# Patient Record
Sex: Male | Born: 1995
Health system: Southern US, Community
[De-identification: ages and names within clinical notes are randomized; demographics above are authoritative.]

## PROBLEM LIST (undated history)

## (undated) ENCOUNTER — Emergency Department (HOSPITAL_COMMUNITY): Payer: BC Managed Care – PPO

## (undated) DIAGNOSIS — M25661 Stiffness of right knee, not elsewhere classified: Secondary | ICD-10-CM

## (undated) HISTORY — PX: ORIF FEMUR FRACTURE: SHX2119

## (undated) HISTORY — PX: SHOULDER SURGERY: SHX246

## (undated) HISTORY — PX: HIP SURGERY: SHX245

## (undated) HISTORY — PX: FEMUR FRACTURE SURGERY: SHX633

## (undated) HISTORY — PX: OTHER SURGICAL HISTORY: SHX169

## (undated) HISTORY — PX: KNEE SURGERY: SHX244

## (undated) HISTORY — PX: WRIST SURGERY: SHX841

---
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
## 2013-03-19 ENCOUNTER — Ambulatory Visit (INDEPENDENT_AMBULATORY_CARE_PROVIDER_SITE_OTHER): Payer: 59 | Admitting: Physician Assistant

## 2013-03-19 VITALS — BP 100/60 | HR 74 | Temp 97.9°F | Resp 16 | Ht 71.5 in | Wt 122.0 lb

## 2013-03-19 DIAGNOSIS — S81009A Unspecified open wound, unspecified knee, initial encounter: Secondary | ICD-10-CM

## 2013-03-19 DIAGNOSIS — W540XXA Bitten by dog, initial encounter: Secondary | ICD-10-CM

## 2013-03-19 DIAGNOSIS — S81801A Unspecified open wound, right lower leg, initial encounter: Secondary | ICD-10-CM

## 2013-03-19 DIAGNOSIS — M79609 Pain in unspecified limb: Secondary | ICD-10-CM

## 2013-03-19 DIAGNOSIS — T148XXA Other injury of unspecified body region, initial encounter: Secondary | ICD-10-CM

## 2013-03-19 MED ORDER — AMOXICILLIN-POT CLAVULANATE 875-125 MG PO TABS: 1.0000 | ORAL_TABLET | Freq: Two times a day (BID) | ORAL | Status: DC

## 2013-03-19 NOTE — Progress Notes (Signed)
  Subjective:    Patient ID: Gerald Munoz, male    DOB: 10-22-1995, 17 y.o.   MRN: [id number]  HPI 17 year old male presents with dog bite to right calf. Injury occurred today around 11:00 a.m. States he was walking his dog this morning and when he tried to put him back in his cage the dog bit him.  It is an outside dog that stays in his cage except for walks.  Does have a history of being aggressive but has never bitten anybody.  Mother is here with the patient and reports that the dog is not up to date on rabies vaccines. Reports normal activity in the dog and it is eating and drinking normally. Patient reports he did wash wound with soap and water.   Patient is otherwise healthy with no other concerns today.     Review of Systems  Constitutional: Negative for fever and chills.  Gastrointestinal: Negative for nausea and vomiting.  Skin: Positive for wound.  Neurological: Negative for headaches.       Objective:   Physical Exam  Constitutional: He is oriented to person, place, and time. He appears well-developed and well-nourished.  HENT:  Head: Normocephalic and atraumatic.  Right Ear: External ear normal.  Left Ear: External ear normal.  Eyes: Conjunctivae are normal.  Neck: Normal range of motion.  Cardiovascular: Normal rate.   Pulmonary/Chest: Effort normal.  Neurological: He is alert and oriented to person, place, and time.  Skin:     Psychiatric: He has a normal mood and affect. His behavior is normal. Judgment and thought content normal.          Assessment & Plan:  Wound, open, leg, right, initial encounter - Plan: amoxicillin-clavulanate (AUGMENTIN) 875-125 MG per tablet  Dog bite, initial encounter  Superficial wound. Local wound care discussed with patient.  Keep covered until it has healed Report faxed to animal control. Advise vaccination of all animals.  Will go ahead and start augmentin 875 mg bid  Follow up if symptoms worsen or fail to improve.

## 2014-03-06 ENCOUNTER — Encounter (HOSPITAL_COMMUNITY): Payer: Self-pay | Admitting: Emergency Medicine

## 2014-03-06 ENCOUNTER — Emergency Department (HOSPITAL_COMMUNITY): Payer: 59

## 2014-03-06 ENCOUNTER — Emergency Department (HOSPITAL_COMMUNITY)
Admission: EM | Admit: 2014-03-06 | Discharge: 2014-03-07 | Disposition: A | Discharge status: Home / Self Care | Payer: 59 | Attending: Emergency Medicine | Admitting: Emergency Medicine

## 2014-03-06 DIAGNOSIS — M25562 Pain in left knee: Secondary | ICD-10-CM

## 2014-03-06 DIAGNOSIS — Y929 Unspecified place or not applicable: Secondary | ICD-10-CM | POA: Insufficient documentation

## 2014-03-06 DIAGNOSIS — Z8781 Personal history of (healed) traumatic fracture: Secondary | ICD-10-CM | POA: Insufficient documentation

## 2014-03-06 DIAGNOSIS — IMO0002 Reserved for concepts with insufficient information to code with codable children: Secondary | ICD-10-CM | POA: Insufficient documentation

## 2014-03-06 DIAGNOSIS — S83006A Unspecified dislocation of unspecified patella, initial encounter: Secondary | ICD-10-CM | POA: Insufficient documentation

## 2014-03-06 DIAGNOSIS — Y939 Activity, unspecified: Secondary | ICD-10-CM | POA: Insufficient documentation

## 2014-03-06 DIAGNOSIS — S83005A Unspecified dislocation of left patella, initial encounter: Secondary | ICD-10-CM

## 2014-03-06 DIAGNOSIS — F17200 Nicotine dependence, unspecified, uncomplicated: Secondary | ICD-10-CM | POA: Insufficient documentation

## 2014-03-06 DIAGNOSIS — Z792 Long term (current) use of antibiotics: Secondary | ICD-10-CM | POA: Insufficient documentation

## 2014-03-06 MED ORDER — PROPOFOL 10 MG/ML IV BOLUS
INTRAVENOUS | Status: AC
  Administered 2014-03-07: 50 mg
  Filled 2014-03-06: qty 1

## 2014-03-06 MED ORDER — FENTANYL CITRATE 0.05 MG/ML IJ SOLN
100.0000 ug | Freq: Once | INTRAMUSCULAR | Status: AC
  Administered 2014-03-06: 100 ug via INTRAVENOUS

## 2014-03-06 MED ORDER — FENTANYL CITRATE 0.05 MG/ML IJ SOLN
INTRAMUSCULAR | Status: AC
  Administered 2014-03-06: 100 ug via INTRAVENOUS
  Filled 2014-03-06: qty 2

## 2014-03-06 NOTE — ED Notes (Signed)
Bed: HL45 Expected date:  Expected time:  Means of arrival:  Comments: EMS 17yo knee injury

## 2014-03-06 NOTE — ED Notes (Signed)
Pt here via ptar, with c/o left knee pain, Left patella looks displaced on arrival. Pt in splint to left leg on arrival. Rates 10/10 pain. No open site assessment.
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
## 2014-03-07 NOTE — Progress Notes (Signed)
150mg  of propofol wasted. Witnessed by Oneita Jolly, RN

## 2014-03-07 NOTE — Discharge Instructions (Signed)
Ace bandage for comfort and support. Followup with orthopedics. Take Tylenol and Motrin for pain.

## 2015-03-15 ENCOUNTER — Encounter (HOSPITAL_COMMUNITY): Payer: Self-pay | Admitting: Emergency Medicine

## 2015-03-15 ENCOUNTER — Emergency Department (HOSPITAL_COMMUNITY)
Admission: EM | Admit: 2015-03-15 | Discharge: 2015-03-15 | Disposition: A | Discharge status: Home / Self Care | Payer: 59 | Source: Home / Self Care | Attending: Family Medicine | Admitting: Family Medicine

## 2015-03-15 DIAGNOSIS — H7291 Unspecified perforation of tympanic membrane, right ear: Secondary | ICD-10-CM

## 2015-03-15 DIAGNOSIS — H9201 Otalgia, right ear: Secondary | ICD-10-CM

## 2015-03-15 MED ORDER — OFLOXACIN 0.3 % OT SOLN
5.0000 [drp] | Freq: Two times a day (BID) | OTIC | Status: AC
Start: 2015-03-15 — End: 2015-03-19
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
## 2015-03-15 NOTE — Discharge Instructions (Signed)
Eardrum Perforation The eardrum is a thin, round tissue inside the ear. It allows you to hear. The eardrum can get torn (perforated). Eardrums often heal on their own. There is often little or no long-term hearing loss. HOME CARE   Keep your ear dry while it heals. Do not swim, dive, or take showers until your doctor says it is okay.  Before you take a bath, put petroleum jelly all over a cotton ball. Put the cotton ball in your ear. This will keep water out.  Only take medicines as told by your doctor.  Blow your nose gently.  Continue normal activities after your eardrum heals. Your doctor will tell you when your eardrum has healed.  Talk to your doctor before flying on an airplane.  Keep all doctor visits as told. This is important. GET HELP RIGHT AWAY IF:   You have blood or yellowish-white fluid (pus) coming from your ear.  You feel off balance.  You feel dizzy, sick to your stomach (nauseous), or you throw up (vomit).  You have more pain.  You have a fever. MAKE SURE YOU:   Understand these instructions.  Will watch your condition.  Will get help right away if you are not doing well or get worse. Document Released: 03/20/2010 Document Revised: 12/23/2011 Document Reviewed: 03/20/2010 [facility] Patient Information 2015 [employer] [place]. This information is not intended to replace advice given to you by your health care provider. Make sure you discuss any questions you have with your health care provider.    Tiny hole to right ear. This should heal on its own very nicely. The hearing should improve in weeks. If you worsen please f[facility]. Complete antibiotic drops for 5 days.

## 2015-03-16 ENCOUNTER — Ambulatory Visit (INDEPENDENT_AMBULATORY_CARE_PROVIDER_SITE_OTHER): Payer: 59 | Admitting: Physician Assistant

## 2015-03-16 VITALS — BP 100/68 | HR 72 | Temp 98.7°F | Resp 16 | Ht 70.0 in | Wt 123.0 lb

## 2015-03-16 DIAGNOSIS — H7291 Unspecified perforation of tympanic membrane, right ear: Secondary | ICD-10-CM | POA: Diagnosis not present

## 2015-03-16 MED ORDER — CIPROFLOXACIN-HYDROCORTISONE 0.2-1 % OT SUSP: 3.0000 [drp] | Freq: Two times a day (BID) | OTIC | Status: DC

## 2015-03-16 NOTE — Patient Instructions (Signed)
You have a ruptured right ear drum. Please place the antibiotic drops in the right ear three times per day for 7 days. Please come back to see us when you're finished with the course if your hearing seems off.
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
## 2015-03-17 ENCOUNTER — Telehealth: Payer: Self-pay | Admitting: Physician Assistant
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
## 2015-03-18 NOTE — Telephone Encounter (Signed)
Thank you for taking care of this Gerald Munoz

## 2016-02-06 ENCOUNTER — Encounter (HOSPITAL_COMMUNITY): Payer: Self-pay | Admitting: Emergency Medicine

## 2016-02-06 ENCOUNTER — Ambulatory Visit (HOSPITAL_COMMUNITY)
Admission: EM | Admit: 2016-02-06 | Discharge: 2016-02-06 | Disposition: A | Discharge status: Home / Self Care | Payer: 59 | Attending: Family Medicine | Admitting: Family Medicine

## 2016-02-06 DIAGNOSIS — M545 Low back pain, unspecified: Secondary | ICD-10-CM

## 2016-02-06 MED ORDER — NAPROXEN SODIUM 550 MG PO TABS: 550.0000 mg | ORAL_TABLET | Freq: Two times a day (BID) | ORAL | Status: DC

## 2016-02-06 NOTE — Discharge Instructions (Signed)

## 2016-02-06 NOTE — ED Notes (Signed)
[facility] a [facility] pain that started 3 days ago when he was playing with his dog and heard something "pop".

## 2016-02-06 NOTE — ED Provider Notes (Signed)
CSN: [id number]     Arrival date & time 02/06/16  1604 History   First MD Initiated Contact with Patient 02/06/16 1656     Chief Complaint  Patient presents with  . Back Pain   (Consider location/radiation/quality/duration/timing/severity/associated sxs/prior Treatment) HPI History obtained from patient: Location: left low back   Context/Duration: 2 days ago, injury while playing with dog.   Severity:3   Quality:ache Timing:      constant      Home Treatment: none Associated symptoms:  Hurts when sitting for a while.   History reviewed. No pertinent past medical history. Past Surgical History  Procedure Laterality Date  . Femur fracture surgery     History reviewed. No pertinent family history. Social History  Substance Use Topics  . Smoking status: Current Some Day Smoker    Types: Cigarettes  . Smokeless tobacco: Never Used  . Alcohol Use: 0.0 oz/week    0 Standard drinks or equivalent per week     Comment: socially    Review of Systems ROS +'ve lumbar back pain  Denies: HEADACHE, NAUSEA, ABDOMINAL PAIN, CHEST PAIN, CONGESTION, DYSURIA, SHORTNESS OF BREATH  Allergies  Review of patient's allergies indicates no known allergies.  Home Medications   Prior to Admission medications   Medication Sig Start Date End Date Taking? Authorizing Provider  ciprofloxacin-hydrocortisone (CIPRO HC) otic suspension Place 3 drops into the right ear 2 (two) times daily. 03/16/15   Raelyn Ensign, PA  ibuprofen (ADVIL,MOTRIN) 200 MG tablet Take 800 mg by mouth every 6 (six) hours as needed (pain).    Historical Provider, MD  naproxen sodium (ANAPROX DS) 550 MG tablet Take 1 tablet (550 mg total) by mouth 2 (two) times daily with a meal. 02/06/16   Tharon Aquas, PA   Meds Ordered and Administered this Visit  Medications - No data to display  BP 100/56 mmHg  Pulse 66  Temp(Src) 98.6 F (37 C) (Oral)  Resp 14  SpO2 100% No data found.   Physical Exam NURSES NOTES AND VITAL  SIGNS REVIEWED. CONSTITUTIONAL: Well developed, well nourished, no acute distress HEENT: normocephalic, atraumatic EYES: Conjunctiva normal NECK:normal ROM, supple, no adenopathy PULMONARY:No respiratory distress, normal effort MUSCULOSKELETAL: Normal ROM of all extremities, Left lumbar spine, no palpable tenderness or spasm Back has full ROM SKIN: warm and dry without rash PSYCHIATRIC: Mood and affect, behavior are normal  ED Course  Procedures (including critical care time)  Labs Review Labs Reviewed - No data to display  Imaging Review No results found.   Visual Acuity Review  Right Eye Distance:   Left Eye Distance:   Bilateral Distance:    Right Eye Near:   Left Eye Near:    Bilateral Near:      PRESCRIPTION: anaprox  SUGGEST CONTINUED SYMPTOMATIC TREATMENT AT HOME.    MDM   1. Left-sided low back pain without sciatica     Patient is reassured that there are no issues that require transfer to higher level of care at this time or additional tests. Patient is advised to continue home symptomatic treatment. Patient is advised that if there are new or worsening symptoms to attend the emergency department, contact primary care provider, or return to UC. Instructions of care provided discharged home in stable condition.    THIS NOTE WAS GENERATED USING A VOICE RECOGNITION SOFTWARE PROGRAM. ALL REASONABLE EFFORTS  WERE MADE TO PROOFREAD THIS DOCUMENT FOR ACCURACY.  I have verbally reviewed the discharge instructions with the patient. A printed AVS  was given to the patient.  All questions were answered prior to discharge.      Tharon AquasFrank C Jamarious Febo, PA 02/06/16 [phone]

## 2016-10-09 ENCOUNTER — Ambulatory Visit (INDEPENDENT_AMBULATORY_CARE_PROVIDER_SITE_OTHER): Payer: 59 | Admitting: Physician Assistant

## 2016-10-09 VITALS — BP 116/54 | HR 86 | Temp 98.5°F | Ht 70.0 in | Wt 131.0 lb

## 2016-10-09 DIAGNOSIS — R1084 Generalized abdominal pain: Secondary | ICD-10-CM

## 2016-10-09 DIAGNOSIS — E860 Dehydration: Secondary | ICD-10-CM | POA: Diagnosis not present

## 2016-10-09 DIAGNOSIS — R110 Nausea: Secondary | ICD-10-CM

## 2016-10-09 LAB — POCT URINALYSIS DIP (MANUAL ENTRY)
BILIRUBIN UA: NEGATIVE
Blood, UA: NEGATIVE
Glucose, UA: NEGATIVE
Leukocytes, UA: NEGATIVE
NITRITE UA: NEGATIVE
PH UA: 5.5
Spec Grav, UA: >=1.03
Urobilinogen, UA: 0.2

## 2016-10-09 LAB — POCT CBC
Granulocyte percent: 88.7 %G — AB (ref 37–80)
HCT, POC: 45.2 % (ref 43.5–53.7)
HEMOGLOBIN: 15.8 g/dL (ref 14.1–18.1)
LYMPH, POC: 0.8 (ref 0.6–3.4)
MCH: 30.9 pg (ref 27–31.2)
MCHC: 35 g/dL (ref 31.8–35.4)
MCV: 88.2 fL (ref 80–97)
MID (cbc): 0.9 (ref 0–0.9)
MPV: 7.2 fL (ref 0–99.8)
POC GRANULOCYTE: 13.5 — AB (ref 2–6.9)
POC LYMPH PERCENT: 5.1 %L — AB (ref 10–50)
POC MID %: 6.2 %M (ref 0–12)
Platelet Count, POC: 248 10*3/uL (ref 142–424)
RBC: 5.13 M/uL (ref 4.69–6.13)
RDW, POC: 12.4 %
WBC: 15.2 10*3/uL — AB (ref 4.6–10.2)

## 2016-10-09 MED ORDER — TRAMADOL HCL 50 MG PO TABS: 25.0000 mg | ORAL_TABLET | Freq: Three times a day (TID) | ORAL | 0 refills | Status: DC | PRN

## 2016-10-09 MED ORDER — ONDANSETRON HCL 4 MG PO TABS: 4.0000 mg | ORAL_TABLET | Freq: Three times a day (TID) | ORAL | 0 refills | Status: DC | PRN

## 2016-10-09 MED ORDER — ONDANSETRON 4 MG PO TBDP
8.0000 mg | ORAL_TABLET | Freq: Once | ORAL | Status: AC
  Administered 2016-10-09: 8 mg via ORAL

## 2016-10-09 NOTE — Patient Instructions (Addendum)
If you begin to have fever then go to the ED.     IF you received an x-ray today, you will receive an invoice from [facility]. Please contact [facility] at [phone] with questions or concerns regarding your invoice.   IF you received labwork today, you will receive an invoice from [facility]. Please contact [facility] at [phone] with questions or concerns regarding your invoice.   Our billing staff will not be able to assist you with questions regarding bills from these companies.  You will be contacted with the lab results as soon as they are available. The fastest way to get your results is to activate your My Chart account. Instructions are located on the last page of this paperwork. If you have not heard from us regarding the results in 2 weeks, please contact this office.

## 2016-10-09 NOTE — Progress Notes (Signed)
10/09/2016 3:19 PM   DOB: December 07, 1995 / MRN: [id number]  SUBJECTIVE:  Gerald Munoz is a 20 y.o. male presenting for nausea and stomach pain that started roughly 15 hours ago.  No emesis. Reports he has had about 2 episodes of non bloody watery stool since.  He denies fever. Reports he drank 1/2 gallon of chocolate milk 30 minutes before his symptoms started. He has tried [employer] bismal and this did not help.   He has No Known Allergies.   He  has no past medical history on file.    He  reports that he has been smoking Cigarettes.  He has never used smokeless tobacco. He reports that he drinks alcohol. He reports that he does not use drugs. He  has no sexual activity history on file. The patient  has a past surgical history that includes Femur fracture surgery.  His family history is not on file.  Review of Systems  Constitutional: Negative for fever.  Respiratory: Negative for cough.   Cardiovascular: Negative for chest pain.  Gastrointestinal: Negative for nausea.  Skin: Negative for itching and rash.  Neurological: Negative for dizziness.    The problem list and medications were reviewed and updated by myself where necessary and exist elsewhere in the encounter.   OBJECTIVE:  BP (!) 116/54 (BP Location: Right Arm, Patient Position: Sitting, Cuff Size: Small)   Pulse 86   Temp 98.5 F (36.9 C) (Oral)   Ht 5\' 10"  (1.778 m)   Wt 131 lb (59.4 kg)   SpO2 100%   BMI 18.80 kg/m   BP Readings from Last 3 Encounters:  10/09/16 (!) 116/54  02/06/16 100/56  03/16/15 100/68     Physical Exam  Constitutional: He is oriented to person, place, and time. He appears well-developed and well-nourished.  Cardiovascular: Normal rate and regular rhythm.   Pulmonary/Chest: Effort normal and breath sounds normal.  Abdominal: Soft. Bowel sounds are normal. He exhibits no distension and no mass. There is no tenderness. There is no rebound and no guarding.  Musculoskeletal: Normal  range of motion.  Neurological: He is alert and oriented to person, place, and time.  Skin: Skin is warm and dry.  Psychiatric: He has a normal mood and affect.    Results for orders placed or performed in visit on 10/09/16 (from the past 72 hour(s))  POCT urinalysis dipstick     Status: Abnormal   Collection Time: 10/09/16  3:01 PM  Result Value Ref Range   Color, UA yellow yellow   Clarity, UA clear clear   Glucose, UA negative negative   Bilirubin, UA negative negative   Ketones, POC UA small (15) (A) negative   Spec Grav, UA >=1.030    Blood, UA negative negative   pH, UA 5.5    Protein Ur, POC trace (A) negative   Urobilinogen, UA 0.2    Nitrite, UA Negative Negative   Leukocytes, UA Negative Negative  POCT CBC     Status: Abnormal   Collection Time: 10/09/16  3:02 PM  Result Value Ref Range   WBC 15.2 (A) 4.6 - 10.2 K/uL   Lymph, poc 0.8 0.6 - 3.4   POC LYMPH PERCENT 5.1 (A) 10 - 50 %L   MID (cbc) 0.9 0 - 0.9   POC MID % 6.2 0 - 12 %M   POC Granulocyte 13.5 (A) 2 - 6.9   Granulocyte percent 88.7 (A) 37 - 80 %G   RBC 5.13 4.69 - 6.13 M/uL  Hemoglobin 15.8 14.1 - 18.1 g/dL   HCT, POC 40.945.2 81.143.5 - 53.7 %   MCV 88.2 80 - 97 fL   MCH, POC 30.9 27 - 31.2 pg   MCHC 35.0 31.8 - 35.4 g/dL   RDW, POC 91.412.4 %   Platelet Count, POC 248 142 - 424 K/uL   MPV 7.2 0 - 99.8 fL    No results found.  ASSESSMENT AND PLAN  Gerald Munoz was seen today for abdominal pain.  Diagnoses and all orders for this visit:  Nausea without vomiting -     ondansetron (ZOFRAN-ODT) disintegrating tablet 8 mg; Take 2 tablets (8 mg total) by mouth once. -     POCT CBC -     POCT urinalysis dipstick  Generalized abdominal pain: He has a high white count however he DOES NOT HAVE A FEVER and has not taken medication to reduce fever. I think his white count is reactive, possibly from nausea and dry heaving.  I have advised that if he gets a fever tonight to go directly to the ED as I am not sending  him for a scan based off of his belly exam and the fact that he denies fever and has no fever here. He agrees that is he has a fever then he will go directly to the ED.         -     traMADol (ULTRAM) 50 MG tablet; Take 0.5-1 tablets (25-50 mg total) by mouth every        8 (eight) hours as needed.   Dehydration: Patient opting to drink copious fluids at home.         The patient is advised to call or return to clinic if he does not see an improvement in symptoms, or to seek the care of the closest emergency department if he worsens with the above plan.   Gerald BostonMichael Munoz, MHS, PA-C [facility] 10/09/2016 3:19 PM

## 2017-05-18 ENCOUNTER — Emergency Department (HOSPITAL_COMMUNITY): Payer: 59

## 2017-05-18 ENCOUNTER — Inpatient Hospital Stay (HOSPITAL_COMMUNITY): Payer: 59 | Admitting: Certified Registered Nurse Anesthetist

## 2017-05-18 ENCOUNTER — Encounter (HOSPITAL_COMMUNITY): Admission: EM | Disposition: A | Discharge status: Home / Self Care | Payer: Self-pay | Source: Home / Self Care

## 2017-05-18 ENCOUNTER — Inpatient Hospital Stay (HOSPITAL_COMMUNITY): Payer: 59

## 2017-05-18 ENCOUNTER — Inpatient Hospital Stay (HOSPITAL_COMMUNITY)
Admission: EM | Admit: 2017-05-18 | Discharge: 2017-05-21 | DRG: 956 | Disposition: A | Discharge status: Home / Self Care | Payer: 59 | Attending: General Surgery | Admitting: General Surgery

## 2017-05-18 ENCOUNTER — Encounter (HOSPITAL_COMMUNITY): Payer: Self-pay | Admitting: *Deleted

## 2017-05-18 DIAGNOSIS — S52572A Other intraarticular fracture of lower end of left radius, initial encounter for closed fracture: Secondary | ICD-10-CM | POA: Diagnosis present

## 2017-05-18 DIAGNOSIS — S72322A Displaced transverse fracture of shaft of left femur, initial encounter for closed fracture: Secondary | ICD-10-CM | POA: Diagnosis not present

## 2017-05-18 DIAGNOSIS — S52502A Unspecified fracture of the lower end of left radius, initial encounter for closed fracture: Secondary | ICD-10-CM | POA: Diagnosis not present

## 2017-05-18 DIAGNOSIS — T148XXA Other injury of unspecified body region, initial encounter: Secondary | ICD-10-CM

## 2017-05-18 DIAGNOSIS — S0181XA Laceration without foreign body of other part of head, initial encounter: Secondary | ICD-10-CM | POA: Diagnosis not present

## 2017-05-18 DIAGNOSIS — S0990XA Unspecified injury of head, initial encounter: Secondary | ICD-10-CM | POA: Diagnosis not present

## 2017-05-18 DIAGNOSIS — S199XXA Unspecified injury of neck, initial encounter: Secondary | ICD-10-CM | POA: Diagnosis not present

## 2017-05-18 DIAGNOSIS — F17200 Nicotine dependence, unspecified, uncomplicated: Secondary | ICD-10-CM | POA: Diagnosis present

## 2017-05-18 DIAGNOSIS — S72332A Displaced oblique fracture of shaft of left femur, initial encounter for closed fracture: Secondary | ICD-10-CM | POA: Diagnosis not present

## 2017-05-18 DIAGNOSIS — S52531A Colles' fracture of right radius, initial encounter for closed fracture: Secondary | ICD-10-CM

## 2017-05-18 DIAGNOSIS — S728X2A Other fracture of left femur, initial encounter for closed fracture: Secondary | ICD-10-CM | POA: Diagnosis not present

## 2017-05-18 DIAGNOSIS — S7292XA Unspecified fracture of left femur, initial encounter for closed fracture: Secondary | ICD-10-CM | POA: Diagnosis present

## 2017-05-18 DIAGNOSIS — S72352A Displaced comminuted fracture of shaft of left femur, initial encounter for closed fracture: Principal | ICD-10-CM | POA: Diagnosis present

## 2017-05-18 DIAGNOSIS — T84195A Other mechanical complication of internal fixation device of left femur, initial encounter: Secondary | ICD-10-CM | POA: Diagnosis not present

## 2017-05-18 DIAGNOSIS — S7222XA Displaced subtrochanteric fracture of left femur, initial encounter for closed fracture: Secondary | ICD-10-CM | POA: Diagnosis not present

## 2017-05-18 DIAGNOSIS — S52501B Unspecified fracture of the lower end of right radius, initial encounter for open fracture type I or II: Secondary | ICD-10-CM | POA: Diagnosis present

## 2017-05-18 DIAGNOSIS — S52601A Unspecified fracture of lower end of right ulna, initial encounter for closed fracture: Secondary | ICD-10-CM | POA: Diagnosis not present

## 2017-05-18 DIAGNOSIS — S5291XB Unspecified fracture of right forearm, initial encounter for open fracture type I or II: Secondary | ICD-10-CM | POA: Diagnosis not present

## 2017-05-18 DIAGNOSIS — T79A11A Traumatic compartment syndrome of right upper extremity, initial encounter: Secondary | ICD-10-CM | POA: Diagnosis not present

## 2017-05-18 DIAGNOSIS — S52571B Other intraarticular fracture of lower end of right radius, initial encounter for open fracture type I or II: Secondary | ICD-10-CM | POA: Diagnosis not present

## 2017-05-18 DIAGNOSIS — S060X9A Concussion with loss of consciousness of unspecified duration, initial encounter: Secondary | ICD-10-CM | POA: Diagnosis not present

## 2017-05-18 DIAGNOSIS — M25532 Pain in left wrist: Secondary | ICD-10-CM

## 2017-05-18 DIAGNOSIS — S098XXA Other specified injuries of head, initial encounter: Secondary | ICD-10-CM | POA: Diagnosis not present

## 2017-05-18 DIAGNOSIS — S3991XA Unspecified injury of abdomen, initial encounter: Secondary | ICD-10-CM

## 2017-05-18 DIAGNOSIS — R52 Pain, unspecified: Secondary | ICD-10-CM

## 2017-05-18 DIAGNOSIS — W130XXA Fall from, out of or through balcony, initial encounter: Secondary | ICD-10-CM | POA: Diagnosis present

## 2017-05-18 DIAGNOSIS — D62 Acute posthemorrhagic anemia: Secondary | ICD-10-CM | POA: Diagnosis not present

## 2017-05-18 DIAGNOSIS — Z4789 Encounter for other orthopedic aftercare: Secondary | ICD-10-CM | POA: Diagnosis not present

## 2017-05-18 DIAGNOSIS — S52612A Displaced fracture of left ulna styloid process, initial encounter for closed fracture: Secondary | ICD-10-CM | POA: Diagnosis present

## 2017-05-18 DIAGNOSIS — S52501A Unspecified fracture of the lower end of right radius, initial encounter for closed fracture: Secondary | ICD-10-CM | POA: Diagnosis not present

## 2017-05-18 DIAGNOSIS — S161XXA Strain of muscle, fascia and tendon at neck level, initial encounter: Secondary | ICD-10-CM

## 2017-05-18 DIAGNOSIS — S298XXA Other specified injuries of thorax, initial encounter: Secondary | ICD-10-CM

## 2017-05-18 DIAGNOSIS — M25531 Pain in right wrist: Secondary | ICD-10-CM | POA: Diagnosis not present

## 2017-05-18 DIAGNOSIS — Z419 Encounter for procedure for purposes other than remedying health state, unspecified: Secondary | ICD-10-CM

## 2017-05-18 DIAGNOSIS — S3993XA Unspecified injury of pelvis, initial encounter: Secondary | ICD-10-CM | POA: Diagnosis not present

## 2017-05-18 DIAGNOSIS — S299XXA Unspecified injury of thorax, initial encounter: Secondary | ICD-10-CM | POA: Diagnosis not present

## 2017-05-18 DIAGNOSIS — W010XXA Fall on same level from slipping, tripping and stumbling without subsequent striking against object, initial encounter: Secondary | ICD-10-CM | POA: Diagnosis not present

## 2017-05-18 HISTORY — PX: HARDWARE REMOVAL: SHX979

## 2017-05-18 HISTORY — PX: INTRAMEDULLARY (IM) NAIL INTERTROCHANTERIC: SHX5875

## 2017-05-18 HISTORY — PX: ORIF WRIST FRACTURE: SHX2133

## 2017-05-18 LAB — URINALYSIS, MICROSCOPIC (REFLEX)
Bacteria, UA: NONE SEEN
RBC / HPF: NONE SEEN RBC/hpf (ref 0–5)
SQUAMOUS EPITHELIAL / LPF: NONE SEEN

## 2017-05-18 LAB — PREPARE FRESH FROZEN PLASMA
UNIT DIVISION: 0
UNIT DIVISION: 0

## 2017-05-18 LAB — BPAM RBC
BLOOD PRODUCT EXPIRATION DATE: 201808072359
Blood Product Expiration Date: 201808062359
ISSUE DATE / TIME: 201808050455
ISSUE DATE / TIME: 201808050455
UNIT TYPE AND RH: 9500
UNIT TYPE AND RH: 9500

## 2017-05-18 LAB — COMPREHENSIVE METABOLIC PANEL
ALBUMIN: 4.2 g/dL (ref 3.5–5.0)
ALT: 21 U/L (ref 17–63)
ANION GAP: 9 (ref 5–15)
AST: 40 U/L (ref 15–41)
Alkaline Phosphatase: 42 U/L (ref 38–126)
BILIRUBIN TOTAL: 0.7 mg/dL (ref 0.3–1.2)
BUN: 11 mg/dL (ref 6–20)
CO2: 21 mmol/L — ABNORMAL LOW (ref 22–32)
Calcium: 8.6 mg/dL — ABNORMAL LOW (ref 8.9–10.3)
Chloride: 109 mmol/L (ref 101–111)
Creatinine, Ser: 0.93 mg/dL (ref 0.61–1.24)
GFR calc Af Amer: >60 mL/min (ref >60)
Glucose, Bld: 100 mg/dL — ABNORMAL HIGH (ref 65–99)
POTASSIUM: 3.1 mmol/L — AB (ref 3.5–5.1)
Sodium: 139 mmol/L (ref 135–145)
TOTAL PROTEIN: 6.6 g/dL (ref 6.5–8.1)

## 2017-05-18 LAB — URINALYSIS, ROUTINE W REFLEX MICROSCOPIC
Bilirubin Urine: NEGATIVE
Glucose, UA: NEGATIVE mg/dL
Ketones, ur: NEGATIVE mg/dL
Leukocytes, UA: NEGATIVE
NITRITE: NEGATIVE
Protein, ur: NEGATIVE mg/dL
SPECIFIC GRAVITY, URINE: 1.01 (ref 1.005–1.030)
pH: 7 (ref 5.0–8.0)

## 2017-05-18 LAB — TYPE AND SCREEN
ABO/RH(D): O POS
ANTIBODY SCREEN: NEGATIVE
Unit division: 0
Unit division: 0

## 2017-05-18 LAB — BPAM FFP
Blood Product Expiration Date: 201808082359
Blood Product Expiration Date: 201808082359
ISSUE DATE / TIME: 201808050457
ISSUE DATE / TIME: 201808050457
UNIT TYPE AND RH: 6200
UNIT TYPE AND RH: 6200

## 2017-05-18 LAB — CBC
HEMATOCRIT: 37.3 % — AB (ref 39.0–52.0)
HEMOGLOBIN: 13.1 g/dL (ref 13.0–17.0)
MCH: 30 pg (ref 26.0–34.0)
MCHC: 35.1 g/dL (ref 30.0–36.0)
MCV: 85.6 fL (ref 78.0–100.0)
Platelets: 224 10*3/uL (ref 150–400)
RBC: 4.36 MIL/uL (ref 4.22–5.81)
RDW: 12.1 % (ref 11.5–15.5)
WBC: 8.7 10*3/uL (ref 4.0–10.5)

## 2017-05-18 LAB — PROTIME-INR
INR: 1.01
Prothrombin Time: 13.3 seconds (ref 11.4–15.2)

## 2017-05-18 LAB — I-STAT CHEM 8, ED
BUN: 12 mg/dL (ref 6–20)
CALCIUM ION: 1.11 mmol/L — AB (ref 1.15–1.40)
CREATININE: 0.9 mg/dL (ref 0.61–1.24)
Chloride: 110 mmol/L (ref 101–111)
Glucose, Bld: 94 mg/dL (ref 65–99)
HCT: 40 % (ref 39.0–52.0)
Hemoglobin: 13.6 g/dL (ref 13.0–17.0)
Potassium: 3.3 mmol/L — ABNORMAL LOW (ref 3.5–5.1)
SODIUM: 144 mmol/L (ref 135–145)
TCO2: 23 mmol/L (ref 0–100)

## 2017-05-18 LAB — CDS SEROLOGY

## 2017-05-18 LAB — ETHANOL: Alcohol, Ethyl (B): 78 mg/dL — ABNORMAL HIGH (ref <5)

## 2017-05-18 LAB — ABO/RH: ABO/RH(D): O POS

## 2017-05-18 LAB — I-STAT CG4 LACTIC ACID, ED: LACTIC ACID, VENOUS: 3.72 mmol/L — AB (ref 0.5–1.9)

## 2017-05-18 LAB — BLOOD PRODUCT ORDER (VERBAL) VERIFICATION

## 2017-05-18 SURGERY — OPEN REDUCTION INTERNAL FIXATION (ORIF) WRIST FRACTURE
Anesthesia: General | Site: Leg Upper | Laterality: Right

## 2017-05-18 SURGERY — OPEN REDUCTION INTERNAL FIXATION (ORIF) DISTAL RADIUS FRACTURE
Anesthesia: General | Laterality: Right

## 2017-05-18 MED ORDER — ROCURONIUM BROMIDE 100 MG/10ML IV SOLN
INTRAVENOUS | Status: DC | PRN
  Administered 2017-05-18: 30 mg via INTRAVENOUS
  Administered 2017-05-18: 50 mg via INTRAVENOUS
  Administered 2017-05-18: 30 mg via INTRAVENOUS
  Administered 2017-05-18: 20 mg via INTRAVENOUS
  Administered 2017-05-18: 50 mg via INTRAVENOUS
  Administered 2017-05-18: 40 mg via INTRAVENOUS

## 2017-05-18 MED ORDER — FENTANYL CITRATE (PF) 100 MCG/2ML IJ SOLN
25.0000 ug | Freq: Once | INTRAMUSCULAR | Status: AC
  Administered 2017-05-18: 25 ug via INTRAVENOUS
  Filled 2017-05-18: qty 2

## 2017-05-18 MED ORDER — DEXTROSE 5 % IV SOLN: 1000.0000 mg | Freq: Four times a day (QID) | INTRAVENOUS | Status: DC | PRN

## 2017-05-18 MED ORDER — BISACODYL 5 MG PO TBEC: 5.0000 mg | DELAYED_RELEASE_TABLET | Freq: Every day | ORAL | Status: DC | PRN

## 2017-05-18 MED ORDER — METOCLOPRAMIDE HCL 5 MG PO TABS: 5.0000 mg | ORAL_TABLET | Freq: Three times a day (TID) | ORAL | Status: DC | PRN

## 2017-05-18 MED ORDER — CEFAZOLIN SODIUM-DEXTROSE 1-4 GM/50ML-% IV SOLN
1.0000 g | Freq: Once | INTRAVENOUS | Status: AC
  Administered 2017-05-18: 1 g via INTRAVENOUS
  Filled 2017-05-18: qty 50

## 2017-05-18 MED ORDER — DIPHENHYDRAMINE HCL 12.5 MG/5ML PO ELIX: 12.5000 mg | ORAL_SOLUTION | ORAL | Status: DC | PRN

## 2017-05-18 MED ORDER — ONDANSETRON HCL 4 MG PO TABS: 4.0000 mg | ORAL_TABLET | Freq: Four times a day (QID) | ORAL | Status: DC | PRN

## 2017-05-18 MED ORDER — FENTANYL CITRATE (PF) 250 MCG/5ML IJ SOLN
INTRAMUSCULAR | Status: AC
  Filled 2017-05-18: qty 5

## 2017-05-18 MED ORDER — PROPOFOL 10 MG/ML IV BOLUS
INTRAVENOUS | Status: AC
  Filled 2017-05-18: qty 20

## 2017-05-18 MED ORDER — DEXAMETHASONE SODIUM PHOSPHATE 4 MG/ML IJ SOLN
INTRAMUSCULAR | Status: DC | PRN
  Administered 2017-05-18: 8 mg via INTRAVENOUS

## 2017-05-18 MED ORDER — OXYCODONE-ACETAMINOPHEN 5-325 MG PO TABS
1.0000 | ORAL_TABLET | Freq: Four times a day (QID) | ORAL | Status: DC | PRN
  Administered 2017-05-18 – 2017-05-20 (×3): 2 via ORAL
  Filled 2017-05-18 (×3): qty 2

## 2017-05-18 MED ORDER — FENTANYL CITRATE (PF) 100 MCG/2ML IJ SOLN
50.0000 ug | Freq: Once | INTRAMUSCULAR | Status: AC
  Administered 2017-05-18: 50 ug via INTRAVENOUS
  Filled 2017-05-18: qty 2

## 2017-05-18 MED ORDER — OXYCODONE HCL 5 MG PO TABS
5.0000 mg | ORAL_TABLET | ORAL | Status: DC | PRN
Start: 2017-05-18 — End: 2017-05-19
  Administered 2017-05-18 – 2017-05-19 (×3): 10 mg via ORAL
  Filled 2017-05-18 (×3): qty 2

## 2017-05-18 MED ORDER — HYDROMORPHONE HCL 1 MG/ML IJ SOLN
INTRAMUSCULAR | Status: AC
  Administered 2017-05-18: 0.5 mg via INTRAVENOUS
  Filled 2017-05-18: qty 1

## 2017-05-18 MED ORDER — STERILE WATER FOR IRRIGATION IR SOLN
Status: DC | PRN
  Administered 2017-05-18: 1000 mL

## 2017-05-18 MED ORDER — ONDANSETRON HCL 4 MG/2ML IJ SOLN: 4.0000 mg | Freq: Four times a day (QID) | INTRAMUSCULAR | Status: DC | PRN

## 2017-05-18 MED ORDER — CEFAZOLIN SODIUM-DEXTROSE 1-4 GM/50ML-% IV SOLN
1.0000 g | Freq: Four times a day (QID) | INTRAVENOUS | Status: AC
  Administered 2017-05-18 – 2017-05-19 (×3): 1 g via INTRAVENOUS
  Filled 2017-05-18 (×3): qty 50

## 2017-05-18 MED ORDER — ONDANSETRON HCL 4 MG/2ML IJ SOLN
INTRAMUSCULAR | Status: DC | PRN
  Administered 2017-05-18: 4 mg via INTRAVENOUS

## 2017-05-18 MED ORDER — METHOCARBAMOL 500 MG PO TABS
1000.0000 mg | ORAL_TABLET | Freq: Four times a day (QID) | ORAL | Status: DC | PRN
  Administered 2017-05-18 – 2017-05-20 (×4): 1000 mg via ORAL
  Filled 2017-05-18 (×4): qty 2

## 2017-05-18 MED ORDER — DOUBLE ANTIBIOTIC 500-10000 UNIT/GM EX OINT
TOPICAL_OINTMENT | CUTANEOUS | Status: AC
  Filled 2017-05-18: qty 1

## 2017-05-18 MED ORDER — TETANUS-DIPHTH-ACELL PERTUSSIS 5-2.5-18.5 LF-MCG/0.5 IM SUSP
0.5000 mL | Freq: Once | INTRAMUSCULAR | Status: AC
  Administered 2017-05-18: 0.5 mL via INTRAMUSCULAR
  Filled 2017-05-18: qty 0.5

## 2017-05-18 MED ORDER — CEFAZOLIN SODIUM-DEXTROSE 2-3 GM-% IV SOLR
INTRAVENOUS | Status: DC | PRN
  Administered 2017-05-18 (×2): 2 g via INTRAVENOUS

## 2017-05-18 MED ORDER — POLYETHYLENE GLYCOL 3350 17 G PO PACK
17.0000 g | PACK | Freq: Every day | ORAL | Status: DC
  Administered 2017-05-19 – 2017-05-21 (×2): 17 g via ORAL
  Filled 2017-05-18: qty 1

## 2017-05-18 MED ORDER — ONDANSETRON 4 MG PO TBDP
4.0000 mg | ORAL_TABLET | Freq: Four times a day (QID) | ORAL | Status: DC | PRN
  Filled 2017-05-18: qty 1

## 2017-05-18 MED ORDER — CEFAZOLIN SODIUM-DEXTROSE 2-4 GM/100ML-% IV SOLN
INTRAVENOUS | Status: AC
  Filled 2017-05-18: qty 100

## 2017-05-18 MED ORDER — DOCUSATE SODIUM 100 MG PO CAPS
100.0000 mg | ORAL_CAPSULE | Freq: Two times a day (BID) | ORAL | Status: DC
  Administered 2017-05-19 – 2017-05-21 (×5): 100 mg via ORAL
  Filled 2017-05-18 (×5): qty 1

## 2017-05-18 MED ORDER — ESMOLOL HCL 100 MG/10ML IV SOLN
INTRAVENOUS | Status: DC | PRN
  Administered 2017-05-18: 20 mg via INTRAVENOUS

## 2017-05-18 MED ORDER — ENOXAPARIN SODIUM 40 MG/0.4ML ~~LOC~~ SOLN: 40.0000 mg | SUBCUTANEOUS | Status: DC

## 2017-05-18 MED ORDER — METOCLOPRAMIDE HCL 5 MG/ML IJ SOLN: 5.0000 mg | Freq: Three times a day (TID) | INTRAMUSCULAR | Status: DC | PRN

## 2017-05-18 MED ORDER — PROPOFOL 10 MG/ML IV BOLUS
INTRAVENOUS | Status: DC | PRN
  Administered 2017-05-18: 140 mg via INTRAVENOUS

## 2017-05-18 MED ORDER — LIDOCAINE HCL (CARDIAC) 20 MG/ML IV SOLN
INTRAVENOUS | Status: DC | PRN
  Administered 2017-05-18: 100 mg via INTRAVENOUS

## 2017-05-18 MED ORDER — ENOXAPARIN SODIUM 40 MG/0.4ML ~~LOC~~ SOLN
40.0000 mg | SUBCUTANEOUS | Status: DC
  Administered 2017-05-19 – 2017-05-20 (×2): 40 mg via SUBCUTANEOUS
  Filled 2017-05-18 (×3): qty 0.4

## 2017-05-18 MED ORDER — SUGAMMADEX SODIUM 200 MG/2ML IV SOLN
INTRAVENOUS | Status: DC | PRN
  Administered 2017-05-18: 108.8 mg via INTRAVENOUS

## 2017-05-18 MED ORDER — ACETAMINOPHEN 650 MG RE SUPP: 650.0000 mg | Freq: Four times a day (QID) | RECTAL | Status: DC | PRN

## 2017-05-18 MED ORDER — SUCCINYLCHOLINE CHLORIDE 20 MG/ML IJ SOLN
INTRAMUSCULAR | Status: DC | PRN
  Administered 2017-05-18: 100 mg via INTRAVENOUS

## 2017-05-18 MED ORDER — FENTANYL CITRATE (PF) 100 MCG/2ML IJ SOLN
INTRAMUSCULAR | Status: DC | PRN
  Administered 2017-05-18: 50 ug via INTRAVENOUS
  Administered 2017-05-18: 100 ug via INTRAVENOUS
  Administered 2017-05-18 (×9): 50 ug via INTRAVENOUS

## 2017-05-18 MED ORDER — SODIUM CHLORIDE 0.9 % IV SOLN
INTRAVENOUS | Status: DC
  Administered 2017-05-18: 100 mL/h via INTRAVENOUS

## 2017-05-18 MED ORDER — ACETAMINOPHEN 325 MG PO TABS
650.0000 mg | ORAL_TABLET | Freq: Four times a day (QID) | ORAL | Status: DC | PRN
Start: 2017-05-18 — End: 2017-05-20

## 2017-05-18 MED ORDER — HYDROMORPHONE HCL 1 MG/ML IJ SOLN
1.0000 mg | INTRAMUSCULAR | Status: DC | PRN
  Administered 2017-05-18: 1 mg via INTRAVENOUS
  Filled 2017-05-18: qty 1

## 2017-05-18 MED ORDER — 0.9 % SODIUM CHLORIDE (POUR BTL) OPTIME
TOPICAL | Status: DC | PRN
  Administered 2017-05-18 (×2): 1000 mL

## 2017-05-18 MED ORDER — LACTATED RINGERS IV SOLN
INTRAVENOUS | Status: DC | PRN
  Administered 2017-05-18 (×2): via INTRAVENOUS

## 2017-05-18 MED ORDER — IOPAMIDOL (ISOVUE-300) INJECTION 61%
INTRAVENOUS | Status: AC
  Administered 2017-05-18: 05:00:00
  Filled 2017-05-18: qty 100

## 2017-05-18 MED ORDER — POTASSIUM CHLORIDE IN NACL 20-0.9 MEQ/L-% IV SOLN
INTRAVENOUS | Status: DC
  Administered 2017-05-18: 19:00:00 via INTRAVENOUS
  Filled 2017-05-18 (×3): qty 1000

## 2017-05-18 MED ORDER — FLEET ENEMA 7-19 GM/118ML RE ENEM: 1.0000 | ENEMA | Freq: Once | RECTAL | Status: DC | PRN

## 2017-05-18 MED ORDER — HYDROMORPHONE HCL 1 MG/ML IJ SOLN
0.2500 mg | INTRAMUSCULAR | Status: DC | PRN
  Administered 2017-05-18 (×2): 0.5 mg via INTRAVENOUS

## 2017-05-18 MED ORDER — HYDROMORPHONE HCL 1 MG/ML IJ SOLN
1.0000 mg | INTRAMUSCULAR | Status: DC | PRN
  Administered 2017-05-18 – 2017-05-19 (×3): 1 mg via INTRAVENOUS
  Filled 2017-05-18 (×5): qty 1

## 2017-05-18 MED ORDER — MIDAZOLAM HCL 5 MG/5ML IJ SOLN
INTRAMUSCULAR | Status: DC | PRN
  Administered 2017-05-18: 2 mg via INTRAVENOUS

## 2017-05-18 SURGICAL SUPPLY — 113 items
BANDAGE ACE 3X5.8 VEL STRL LF (GAUZE/BANDAGES/DRESSINGS) ×5 IMPLANT
BANDAGE ACE 4X5 VEL STRL LF (GAUZE/BANDAGES/DRESSINGS) ×5 IMPLANT
BIT DRILL 2.2 SS TIBIAL (BIT) ×5 IMPLANT
BIT DRILL 3.8X6 NS (BIT) ×5 IMPLANT
BIT DRILL 5.3 (BIT) ×5 IMPLANT
BIT DRILL 6.5X4.8 (BIT) ×5 IMPLANT
BLADE CLIPPER SURG (BLADE) IMPLANT
BNDG COHESIVE 6X5 TAN STRL LF (GAUZE/BANDAGES/DRESSINGS) IMPLANT
BNDG ESMARK 4X9 LF (GAUZE/BANDAGES/DRESSINGS) ×5 IMPLANT
BNDG GAUZE ELAST 4 BULKY (GAUZE/BANDAGES/DRESSINGS) ×5 IMPLANT
BRUSH SCRUB SURG 4.25 DISP (MISCELLANEOUS) ×10 IMPLANT
BUR EGG ELITE 4.0 (BURR) ×4 IMPLANT
BUR EGG ELITE 4.0MM (BURR) ×1
CORDS BIPOLAR (ELECTRODE) ×5 IMPLANT
COVER PERINEAL POST (MISCELLANEOUS) ×5 IMPLANT
COVER SURGICAL LIGHT HANDLE (MISCELLANEOUS) ×15 IMPLANT
CUFF TOURNIQUET SINGLE 18IN (TOURNIQUET CUFF) ×5 IMPLANT
CUFF TOURNIQUET SINGLE 24IN (TOURNIQUET CUFF) IMPLANT
DRAIN TLS ROUND 10FR (DRAIN) IMPLANT
DRAPE C-ARM 35X43 STRL (DRAPES) ×5 IMPLANT
DRAPE C-ARMOR (DRAPES) ×5 IMPLANT
DRAPE HALF SHEET 40X57 (DRAPES) IMPLANT
DRAPE OEC MINIVIEW 54X84 (DRAPES) ×5 IMPLANT
DRAPE ORTHO SPLIT 77X108 STRL (DRAPES)
DRAPE STERI IOBAN 125X83 (DRAPES) ×5 IMPLANT
DRAPE SURG 17X11 SM STRL (DRAPES) ×5 IMPLANT
DRAPE SURG ORHT 6 SPLT 77X108 (DRAPES) IMPLANT
DRAPE U-SHAPE 47X51 STRL (DRAPES) ×5 IMPLANT
DRSG ADAPTIC 3X8 NADH LF (GAUZE/BANDAGES/DRESSINGS) ×5 IMPLANT
DRSG EMULSION OIL 3X3 NADH (GAUZE/BANDAGES/DRESSINGS) ×5 IMPLANT
DRSG MEPILEX BORDER 4X12 (GAUZE/BANDAGES/DRESSINGS) ×5 IMPLANT
DRSG MEPILEX BORDER 4X4 (GAUZE/BANDAGES/DRESSINGS) ×5 IMPLANT
DRSG MEPILEX BORDER 4X8 (GAUZE/BANDAGES/DRESSINGS) ×5 IMPLANT
ELECT REM PT RETURN 9FT ADLT (ELECTROSURGICAL) ×5
ELECTRODE REM PT RTRN 9FT ADLT (ELECTROSURGICAL) ×3 IMPLANT
GAUZE SPONGE 4X4 12PLY STRL (GAUZE/BANDAGES/DRESSINGS) ×5 IMPLANT
GAUZE XEROFORM 5X9 LF (GAUZE/BANDAGES/DRESSINGS) ×5 IMPLANT
GLOVE BIO SURGEON STRL SZ7.5 (GLOVE) ×5 IMPLANT
GLOVE BIO SURGEON STRL SZ8 (GLOVE) ×5 IMPLANT
GLOVE BIOGEL PI IND STRL 7.5 (GLOVE) ×3 IMPLANT
GLOVE BIOGEL PI IND STRL 8 (GLOVE) ×3 IMPLANT
GLOVE BIOGEL PI IND STRL 8.5 (GLOVE) ×3 IMPLANT
GLOVE BIOGEL PI INDICATOR 7.5 (GLOVE) ×2
GLOVE BIOGEL PI INDICATOR 8 (GLOVE) ×2
GLOVE BIOGEL PI INDICATOR 8.5 (GLOVE) ×2
GLOVE SURG ORTHO 8.0 STRL STRW (GLOVE) ×5 IMPLANT
GOWN STRL REUS W/ TWL LRG LVL3 (GOWN DISPOSABLE) ×15 IMPLANT
GOWN STRL REUS W/ TWL XL LVL3 (GOWN DISPOSABLE) ×6 IMPLANT
GOWN STRL REUS W/TWL LRG LVL3 (GOWN DISPOSABLE) ×10
GOWN STRL REUS W/TWL XL LVL3 (GOWN DISPOSABLE) ×4
GUIDEPIN 3.2X17.5 THRD DISP (PIN) ×5 IMPLANT
GUIDEWIRE BALL NOSE 100CM (WIRE) ×5 IMPLANT
K-WIRE 1.6 (WIRE) ×2
K-WIRE FX5X1.6XNS BN SS (WIRE) ×3
KIT BASIN OR (CUSTOM PROCEDURE TRAY) ×10 IMPLANT
KIT ROOM TURNOVER OR (KITS) ×10 IMPLANT
KWIRE FX5X1.6XNS BN SS (WIRE) ×3 IMPLANT
LINER BOOT UNIVERSAL DISP (MISCELLANEOUS) IMPLANT
MANIFOLD NEPTUNE II (INSTRUMENTS) ×10 IMPLANT
NAIL TROCH LH 9X40 (Nail) ×5 IMPLANT
NEEDLE HYPO 25X1 1.5 SAFETY (NEEDLE) ×5 IMPLANT
NS IRRIG 1000ML POUR BTL (IV SOLUTION) ×10 IMPLANT
PACK GENERAL/GYN (CUSTOM PROCEDURE TRAY) ×5 IMPLANT
PACK ORTHO EXTREMITY (CUSTOM PROCEDURE TRAY) ×5 IMPLANT
PAD ARMBOARD 7.5X6 YLW CONV (MISCELLANEOUS) ×25 IMPLANT
PAD CAST 3X4 CTTN HI CHSV (CAST SUPPLIES) ×3 IMPLANT
PAD CAST 4YDX4 CTTN HI CHSV (CAST SUPPLIES) ×3 IMPLANT
PADDING CAST COTTON 3X4 STRL (CAST SUPPLIES) ×2
PADDING CAST COTTON 4X4 STRL (CAST SUPPLIES) ×2
PEG LOCKING SMOOTH 2.2X20 (Screw) ×5 IMPLANT
PEG LOCKING SMOOTH 2.2X22 (Screw) ×15 IMPLANT
PEG LOCKING SMOOTH 2.2X24 (Peg) ×5 IMPLANT
PLATE DVR CROSSLOCK STD RT (Plate) ×5 IMPLANT
REAMER ONE STEP 12.2MM (TRAUMA) ×5 IMPLANT
SCREW ACE CORTICAL (Screw) ×2 IMPLANT
SCREW ACECAP 32MM (Screw) ×5 IMPLANT
SCREW ACECAP 34MM (Screw) ×5 IMPLANT
SCREW BN FT 75X6.5XST DRV (Screw) ×3 IMPLANT
SCREW LAG 6.5MMX85MM (Screw) ×5 IMPLANT
SCREW LOCK 14X2.7X 3 LD TPR (Screw) ×3 IMPLANT
SCREW LOCK 16X2.7X 3 LD TPR (Screw) ×12 IMPLANT
SCREW LOCK 20X2.7X 3 LD TPR (Screw) ×3 IMPLANT
SCREW LOCKING 2.7X13MM (Screw) ×5 IMPLANT
SCREW LOCKING 2.7X14 (Screw) ×2 IMPLANT
SCREW LOCKING 2.7X16 (Screw) ×8 IMPLANT
SCREW LOCKING 2.7X20MM (Screw) ×2 IMPLANT
SCREW NLOCK 24X2.7 3 LD (Screw) ×3 IMPLANT
SCREW NONLOCK 2.7X24 (Screw) ×2 IMPLANT
SOAP 2 % CHG 4 OZ (WOUND CARE) ×5 IMPLANT
SPLINT FIBERGLASS 3X35 (CAST SUPPLIES) ×5 IMPLANT
SPONGE LAP 18X18 X RAY DECT (DISPOSABLE) ×10 IMPLANT
STAPLER VISISTAT 35W (STAPLE) ×5 IMPLANT
STOCKINETTE IMPERVIOUS LG (DRAPES) IMPLANT
SUT ETHILON 2 0 PSLX (SUTURE) ×10 IMPLANT
SUT ETHILON 3 0 PS 1 (SUTURE) ×5 IMPLANT
SUT PROLENE 4 0 PS 2 18 (SUTURE) ×5 IMPLANT
SUT VIC AB 0 CT1 27 (SUTURE) ×2
SUT VIC AB 0 CT1 27XBRD ANBCTR (SUTURE) ×3 IMPLANT
SUT VIC AB 1 CT1 27 (SUTURE) ×8
SUT VIC AB 1 CT1 27XBRD ANBCTR (SUTURE) ×12 IMPLANT
SUT VIC AB 2-0 CT1 27 (SUTURE) ×6
SUT VIC AB 2-0 CT1 TAPERPNT 27 (SUTURE) ×9 IMPLANT
SUT VIC AB 2-0 FS1 27 (SUTURE) ×5 IMPLANT
SUT VICRYL 4-0 PS2 18IN ABS (SUTURE) IMPLANT
SUT VICRYL RAPIDE 4/0 PS 2 (SUTURE) ×10 IMPLANT
SYR BULB IRRIGATION 50ML (SYRINGE) ×5 IMPLANT
SYR CONTROL 10ML LL (SYRINGE) IMPLANT
SYSTEM CHEST DRAIN TLS 7FR (DRAIN) IMPLANT
TOWEL OR 17X24 6PK STRL BLUE (TOWEL DISPOSABLE) ×10 IMPLANT
TOWEL OR 17X26 10 PK STRL BLUE (TOWEL DISPOSABLE) ×15 IMPLANT
TUBE CONNECTING 12'X1/4 (SUCTIONS) ×1
TUBE CONNECTING 12X1/4 (SUCTIONS) ×4 IMPLANT
WATER STERILE IRR 1000ML POUR (IV SOLUTION) ×10 IMPLANT

## 2017-05-18 NOTE — Op Note (Signed)
NAMStandley Munoz:  Chavira, [name]          ACCOUNT NO.:  [id number]  MEDICAL RECORD NO.:  [id number]  LOCATION:  [id number]                        FACILITY:  [facility]  PHYSICIAN:  Doralee AlbinoMichael H. Carola FrostHandy, M.D. DATE OF BIRTH:  09-17-1996  DATE OF PROCEDURE:  05/18/2017 DATE OF DISCHARGE:                              OPERATIVE REPORT   PREOPERATIVE DIAGNOSES: 1. Left subtrochanteric femur fracture. 2. Remote femoral plate with bone overgrowth. 3. Right distal radius fracture.  POSTOPERATIVE DIAGNOSES: 1. Left subtrochanteric femur fracture. 2. Remote femoral plate with bone overgrowth. 3. Right distal radius fracture.  PROCEDURES: 1. Open reduction and internal fixation of right wrist by Dr. Bradly BienenstockFred     Ortmann, please refer to his separate dictation and note. 2. Intramedullary nailing of the left subtrochanteric femur with a     Biomet VersaNail 9 x 400-mm statically locked. 3. Femoral osteotomy. 4. Removal of hardware.  SURGEON FOR PROCEDURE 1:  Sharma CovertFred W. Ortmann, M.D.  SURGEON FOR PROCEDURES 2 THROUGH 4:  Doralee AlbinoMichael H. Carola FrostHandy, M.D.  ASSISTANT:  Mearl LatinKeith W Paul, PA-C.  ANESTHESIA:  General.  COMPLICATIONS:  None.  I/O:  2000 mL crystalloid; out 1500 of urine, 350 mL EBL.  DRAINS:  None.  SPECIMENS:  None.  DISPOSITION:  To PACU.  CONDITION:  Stable.  BRIEF SUMMARY OF INDICATIONS FOR PROCEDURE:  Gerald Munoz is a 21- year-old male who jumped from a third-story balcony sustaining a left subtrochanteric femur fracture above an old plate as well as a comminuted right distal radius fracture.  I discussed with the patient preoperatively the risks and benefits of repair of the femur including the possibility of plating over the old plate if it had completely overgrown with bone.  The potential for nerve injury, vessel injury, DVT, PE, malunion, nonunion, symptomatic hardware, and need for further surgery among others.  He also understood the potential for long extensile incision in the event of  plating.  After full discussion, he did wish to proceed.  BRIEF SUMMARY OF PROCEDURE:  The patient was taken to the operating room where general anesthesia was induced.  Dr. Melvyn Novasrtmann then performed the right wrist repair.  During that procedure, the patient though on the Hana table was not in any traction for his lower extremities to reduce the time and pressure along the perineum.  As Dr. Melvyn Novasrtmann concluded his case, we then moved to restore more length and full length to the femur. The right leg largely remained relaxed and the scissor positioned.  The C-arm was brought in and correct incision determined for plate removal. I then made incision, split the tensor in line with the skin incision, placed retractors, split the vastus near the posterior edge, retracting the entire muscle belly anteriorly, identifying the perforating vessels, cauterizing and dividing them and then lifted up to look at the femur. There was not one area of plate visible, it was entirely beneath and encased within the bone.  It could not be palpated.  C-arm, however, was used to identify where it was.  At that point, we chose to accept the position of the plate rather than to perform an osteotomy through the lateral cortex just to Get down to the level of the plate and we were  planning to use the DCS dynamic condylar plate from [employer] as a long enough blade plate was not available in the hospital.  Exposure was extended proximally.  A Lane clamp was used to obtain reduction of the fracture and the DCS plate was brought in.  It was long enough to perform the job with retention of the old plate, but the patient's femoral neck's high valgus angle precluded safe placement of that implant. We then were planning to place a Biomet locking plate, but as I traced the fracture out to the proximal end of the distal fragment, I felt that I could perform an isolated osteotomy of the femur down to the correct level and then  possibly expose And remove the plate.  In order to perform the osteotomy, I did go to distal and used a series of half and fourth-inch osteotomes to begin cracking the lateral cortex of the femur.  I went along the length of where the plate was, getting down to the plate in certain areas.  I supplemented the osteotome work with use of a large caliber drill and connected the osteotomies along this line. The amount of cortex over the plate was 4 mm thick in most areas.  I continued until the ostetomy brought me down to the level of the screw heads within the plate along its entire length..  Following the osteotomies to expose the heads of the screw, I then proceeded with the attempted hardware removal.  The areas within the heads of the screws were completely grown over and there was bone around the heads.  Consequently I used osteotomes around the head of the screws and then I had to take a small drill and drill into the head of the screw and then used a small curette to break out the additional bone and remove it.  In sequential fashion, then I was able to expose and remove bone from all the screw heads.  As I continued with the hardware removal of portion of the case, I would loosen the screw and then withdrawing it.  Two of the screws, however, were fully ingrown and the heads twisted off.  The shaft could not be accessed underneath because of the plate, necessitating plate removal.  Consequently at that time, I took the high-speed TPS bur and began to work around the plate.  I first took a drill, which was a technique I used at the corticotomy above and I drilled multiple passes into this 4-mm thick mantle of bone over the plate to assist with the osteotomy.  In this case, I then used the bur in addition to the drill and ultimately exposed the more posterior half of the plate.  I was then able to remove it and had broken in two.  I then used the trephines from the hardware removal set  and went around the completely ingrown screws and not until I had reached the far cortex where the screws able to come out.  Having complete the hardware portion, I then turned to repair of the fracture.  The curved cannulated awl was used to establish the correct starting point trajectory just tip of the lateral troch in the center-center position of the proximal femur, this was advanced across into the shaft. I did pull additional traction and derotate the fracture to dial in the reduction and re-establish a very slight valgus orientation and certainly avoid varus.  It was held in this reduced position during sequential reaming and then placement of the 9  x 400-mm nail.  A proximal lock was placed along the calcar in classic second-generation recon fashion and then additional proximal-to-posterior medial screw from greater to lesser troch was placed as well.  I turned attention distally where two locking bolts were placed using perfect circle technique.  Final images were obtained showing excellent reduction, hardware placement, trajectory and length.  Montez Morita, PA-C was present.  I did require an assistant throughout for the osteotomy, which was again very technically demanding and lengthy as well as for hardware removal and also ultimately for intramedullary nailing.  We irrigated and closed the wound in standard layered fashion using running 0 and figure-of-eight 0 Vicryl for the vastus lateralis, interrupted figure-of- eight #1 Vicryl for the tensor and then 2-0 Vicryl and 2-0 nylon for the skin.  Sterile gently compressive dressing was applied.  The patient was awakened from anesthesia and transported to the PACU in stable condition.  It should be noted that we did release traction as soon as the nail was across the fracture site of both proximal locks had been placed to allow for additional compression at the fracture site.  PROGNOSIS:  Mr. Wymore should do well with high rate  of healing.  Given the construct, we were able to obtain.  Because of the technical difficulty, I would expect his early function be somewhat slow, but he will have immediate range of motion of the knee, hip and ankle and may be progressed to weightbearing as tolerated fairly quickly.  He will be on formal pharmacologic DVT prophylaxis and will be expected to stay for several days prior to discharge and then follow up in the office in 2 weeks for removal of sutures.  At the current time, the police seem to be quite interested in his progression and consequently were not entirely sure whether this followup plan will be feasible.     Doralee Albino. Carola Frost, M.D.     MHH/MEDQ  D:  05/18/2017  T:  05/18/2017  Job:  [id number]

## 2017-05-18 NOTE — Consult Note (Signed)
Reason for Consult:Right distal radius fracture Referring Physician: Dr. Gaylene Brooks. Gerald Munoz is an 21 y.o. male.  HPI: Gerald Munoz is a 21 y.o. male.  The history is provided by the patient and the EMS personnel. The history is limited by the condition of the patient.  Fall  This is a new problem. Episode onset: UNKNOWN. The problem occurs constantly. The problem has been rapidly worsening. Pertinent negatives include no chest pain and no abdominal pain. Exacerbated by: MOVEMENT. The symptoms are relieved by rest.  pt presents as level 1 trauma for fall from "45 feet" per EMS Unknown why patient fell Pt has pain in right wrist and his left femur with obvious deformities Otherwise pt is awake/alert No other details are known at arrival  History reviewed. No pertinent past medical history.  Past Surgical History:  Procedure Laterality Date  . FEMUR FRACTURE SURGERY    . KNEE SURGERY      History reviewed. No pertinent family history.  Social History:  reports that he has been smoking.  He has never used smokeless tobacco. He reports that he drinks alcohol. He reports that he uses drugs.  Allergies: No Known Allergies  Medications: I have reviewed the patient's current medications.  Results for orders placed or performed during the hospital encounter of 05/18/17 (from the past 48 hour(s))  Type and screen     Status: None   Collection Time: 05/18/17  5:30 AM  Result Value Ref Range   ABO/RH(D) O POS    Antibody Screen NEG    Sample Expiration 05/21/2017    Unit Number [id number]    Blood Component Type RBC LR PHER2    Unit division 00    Status of Unit REL FROM [facility]    Unit tag comment VERBAL ORDERS PER DR Christy Gentles    Transfusion Status OK TO TRANSFUSE    Crossmatch Result NOT NEEDED    Unit Number [id number]    Blood Component Type RED CELLS,LR    Unit division 00    Status of Unit REL FROM [facility]    Unit tag comment  VERBAL ORDERS PER DR Christy Gentles    Transfusion Status OK TO TRANSFUSE    Crossmatch Result NOT NEEDED   Prepare fresh frozen plasma     Status: None   Collection Time: 05/18/17  5:30 AM  Result Value Ref Range   Unit Number [id number]    Blood Component Type THAWED PLASMA    Unit division 00    Status of Unit REL FROM [facility]    Unit tag comment VERBAL ORDERS PER DR Christy Gentles    Transfusion Status OK TO TRANSFUSE    Unit Number [id number]    Blood Component Type THAWED PLASMA    Unit division 00    Status of Unit REL FROM [facility]    Unit tag comment VERBAL ORDERS PER DR Christy Gentles    Transfusion Status OK TO TRANSFUSE   CDS serology     Status: None   Collection Time: 05/18/17  5:30 AM  Result Value Ref Range   CDS serology specimen STAT   Comprehensive metabolic panel     Status: Abnormal   Collection Time: 05/18/17  5:30 AM  Result Value Ref Range   Sodium 139 135 - 145 mmol/L   Potassium 3.1 (L) 3.5 - 5.1 mmol/L   Chloride 109 101 - 111 mmol/L   CO2 21 (L) 22 - 32 mmol/L   Glucose, Bld 100 (H) 65 -  99 mg/dL   BUN 11 6 - 20 mg/dL   Creatinine, Ser 0.93 0.61 - 1.24 mg/dL   Calcium 8.6 (L) 8.9 - 10.3 mg/dL   Total Protein 6.6 6.5 - 8.1 g/dL   Albumin 4.2 3.5 - 5.0 g/dL   AST 40 15 - 41 U/L   ALT 21 17 - 63 U/L   Alkaline Phosphatase 42 38 - 126 U/L   Total Bilirubin 0.7 0.3 - 1.2 mg/dL   GFR calc non Af Amer >60 >60 mL/min   GFR calc Af Amer >60 >60 mL/min    Comment: (NOTE) The eGFR has been calculated using the CKD EPI equation. This calculation has not been validated in all clinical situations. eGFR's persistently <60 mL/min signify possible Chronic Kidney Disease.    Anion gap 9 5 - 15  CBC     Status: Abnormal   Collection Time: 05/18/17  5:30 AM  Result Value Ref Range   WBC 8.7 4.0 - 10.5 K/uL   RBC 4.36 4.22 - 5.81 MIL/uL   Hemoglobin 13.1 13.0 - 17.0 g/dL   HCT 37.3 (L) 39.0 - 52.0 %   MCV 85.6 78.0 - 100.0 fL   MCH 30.0 26.0 - 34.0 pg   MCHC 35.1 30.0 -  36.0 g/dL   RDW 12.1 11.5 - 15.5 %   Platelets 224 150 - 400 K/uL  Ethanol     Status: Abnormal   Collection Time: 05/18/17  5:30 AM  Result Value Ref Range   Alcohol, Ethyl (B) 78 (H) <5 mg/dL    Comment:        LOWEST DETECTABLE LIMIT FOR SERUM ALCOHOL IS 5 mg/dL FOR MEDICAL PURPOSES ONLY   Protime-INR     Status: None   Collection Time: 05/18/17  5:30 AM  Result Value Ref Range   Prothrombin Time 13.3 11.4 - 15.2 seconds   INR 1.01   ABO/Rh     Status: None   Collection Time: 05/18/17  5:30 AM  Result Value Ref Range   ABO/RH(D) O POS   I-Stat Chem 8, ED     Status: Abnormal   Collection Time: 05/18/17  5:38 AM  Result Value Ref Range   Sodium 144 135 - 145 mmol/L   Potassium 3.3 (L) 3.5 - 5.1 mmol/L   Chloride 110 101 - 111 mmol/L   BUN 12 6 - 20 mg/dL   Creatinine, Ser 0.90 0.61 - 1.24 mg/dL   Glucose, Bld 94 65 - 99 mg/dL   Calcium, Ion 1.11 (L) 1.15 - 1.40 mmol/L   TCO2 23 0 - 100 mmol/L   Hemoglobin 13.6 13.0 - 17.0 g/dL   HCT 40.0 39.0 - 52.0 %  I-Stat CG4 Lactic Acid, ED     Status: Abnormal   Collection Time: 05/18/17  5:38 AM  Result Value Ref Range   Lactic Acid, Venous 3.72 (HH) 0.5 - 1.9 mmol/L   Comment NOTIFIED PHYSICIAN   Urinalysis, Routine w reflex microscopic     Status: Abnormal   Collection Time: 05/18/17  7:10 AM  Result Value Ref Range   Color, Urine YELLOW YELLOW   APPearance CLEAR CLEAR   Specific Gravity, Urine 1.010 1.005 - 1.030   pH 7.0 5.0 - 8.0   Glucose, UA NEGATIVE NEGATIVE mg/dL   Hgb urine dipstick TRACE (A) NEGATIVE   Bilirubin Urine NEGATIVE NEGATIVE   Ketones, ur NEGATIVE NEGATIVE mg/dL   Protein, ur NEGATIVE NEGATIVE mg/dL   Nitrite NEGATIVE NEGATIVE   Leukocytes, UA  NEGATIVE NEGATIVE  Urinalysis, Microscopic (reflex)     Status: None   Collection Time: 05/18/17  7:10 AM  Result Value Ref Range   RBC / HPF NONE SEEN 0 - 5 RBC/hpf   WBC, UA 0-5 0 - 5 WBC/hpf   Bacteria, UA NONE SEEN NONE SEEN   Squamous Epithelial /  LPF NONE SEEN NONE SEEN    Dg Wrist Complete Right  Result Date: 05/18/2017 CLINICAL DATA:  Status post fall from third history of building, with right wrist pain. Initial encounter. EXAM: RIGHT WRIST - COMPLETE 3+ VIEW COMPARISON:  None. FINDINGS: There is a comminuted and significantly displaced fracture of the distal radius, with marked dorsal and radial displacement, and shortening, and surrounding soft tissue swelling. There also appears to be a displaced ulnar styloid fracture. The carpal rows articulate with the displaced distal radial fragments. The carpal rows appear grossly intact. IMPRESSION: Comminuted and significantly displaced fracture of the distal radius, with marked dorsal and radial displacement, and shortening. Displaced ulnar styloid fracture. Electronically Signed   By: Garald Balding M.D.   On: 05/18/2017 06:18   Ct Head Wo Contrast  Result Date: 05/18/2017 CLINICAL DATA:  Initial evaluation for acute trauma. EXAM: CT HEAD WITHOUT CONTRAST CT CERVICAL SPINE WITHOUT CONTRAST TECHNIQUE: Multidetector CT imaging of the head and cervical spine was performed following the standard protocol without intravenous contrast. Multiplanar CT image reconstructions of the cervical spine were also generated. COMPARISON:  None. FINDINGS: CT HEAD FINDINGS Brain: Study degraded by motion. Cerebral volume normal. No acute intracranial hemorrhage or infarct. No mass lesion, midline shift or mass effect. No hydrocephalus. No extra-axial fluid collection. Vascular: No hyperdense vessel. Skull: Soft tissue contusions with laceration present at the central forehead near the vertex an at the left supraorbital region. Scalp soft tissues otherwise within normal limits. Calvarium intact. Sinuses/Orbits: Globes and orbital soft tissues normal. Minimal mucosal thickening within the left sphenoid sinus. Paranasal sinuses are otherwise clear. No mastoid effusion. CT CERVICAL SPINE FINDINGS Alignment: Vertebral bodies  normally aligned with preservation of the normal cervical lordosis. No listhesis. Skull base and vertebrae: Skullbase intact. Normal C1-2 articulations are preserved in the dens is intact. Vertebral body heights maintained. No acute fracture. Soft tissues and spinal canal: Soft tissues of the neck demonstrate no acute abnormality. No prevertebral edema. Disc levels: No significant degenerative changes within the cervical spine. Upper chest: Visualized upper chest within normal limits. Visualized lung apices are clear. No apical pneumothorax. IMPRESSION: CT BRAIN: 1. No acute intracranial process. 2. Multifocal scalp contusions with lacerations about the forehead. Calvarium intact. CT CERVICAL SPINE: No acute traumatic injury within the cervical spine. Electronically Signed   By: Jeannine Boga M.D.   On: 05/18/2017 05:59   Ct Chest W Contrast  Result Date: 05/18/2017 CLINICAL DATA:  Initial evaluation for acute trauma, fall from 3 story building. EXAM: CT CHEST, ABDOMEN, AND PELVIS WITH CONTRAST TECHNIQUE: Multidetector CT imaging of the chest, abdomen and pelvis was performed following the standard protocol during bolus administration of intravenous contrast. CONTRAST:  <See Chart> ISOVUE-300 IOPAMIDOL (ISOVUE-300) INJECTION 61% COMPARISON:  None. FINDINGS: CT CHEST FINDINGS Cardiovascular: Intrathoracic aorta of normal caliber and appearance without acute traumatic injury. Partially visualized great vessels within normal limits. Heart size normal. No pericardial effusion. Limited evaluation of the pulmonary arterial tree unremarkable. Mediastinum/Nodes: Thyroid normal. No pathologically enlarged mediastinal, hilar, or axillary adenopathy. Esophagus within normal limits. No mediastinal hematoma or pneumomediastinum. Lungs/Pleura: Tracheobronchial tree intact and patent. Lungs are clear bilaterally.  No focal infiltrates to suggest pneumonia or contusion. No pulmonary edema or pleural effusion. No  pneumothorax. No worrisome pulmonary nodule or mass. Musculoskeletal: External soft tissues within normal limits. No acute osseus abnormality. No fracture. No worrisome lytic or blastic osseous lesions. CT ABDOMEN PELVIS FINDINGS Hepatobiliary: Liver demonstrates a normal contrast enhanced appearance. Gallbladder within normal limits. No biliary dilatation. Pancreas: Pancreas within normal limits. Spleen: Spleen within normal limits. Adrenals/Urinary Tract: Adrenal glands are normal. Kidneys equal size with symmetric enhancement. No nephrolithiasis, hydronephrosis, or focal enhancing renal mass. No hydroureter. Bladder within normal limits. Stomach/Bowel: Stomach within normal limits. No evidence for bowel obstruction or acute bowel injury. Appendix normal. No acute inflammatory changes seen about the bowels. Vascular/Lymphatic: Normal intravascular enhancement seen throughout the intra-abdominal aorta and its branch vessels. No adenopathy. Reproductive: Prostate normal. Other: No free air or fluid. No mesenteric or retroperitoneal hematoma. Musculoskeletal: External soft tissues demonstrate no acute abnormality. Acute proximal left femoral shaft fracture. Prior not able plate screw fixation noted at the left femur, incompletely visualized. No other acute osseus abnormality. No worrisome lytic or blastic osseous lesions. IMPRESSION: 1. Acute proximal left femoral shaft fracture, better evaluated on prior radiograph. 2. No other acute traumatic injury within the chest, abdomen, and pelvis. Electronically Signed   By: Jeannine Boga M.D.   On: 05/18/2017 06:17   Ct Cervical Spine Wo Contrast  Result Date: 05/18/2017 CLINICAL DATA:  Initial evaluation for acute trauma. EXAM: CT HEAD WITHOUT CONTRAST CT CERVICAL SPINE WITHOUT CONTRAST TECHNIQUE: Multidetector CT imaging of the head and cervical spine was performed following the standard protocol without intravenous contrast. Multiplanar CT image reconstructions  of the cervical spine were also generated. COMPARISON:  None. FINDINGS: CT HEAD FINDINGS Brain: Study degraded by motion. Cerebral volume normal. No acute intracranial hemorrhage or infarct. No mass lesion, midline shift or mass effect. No hydrocephalus. No extra-axial fluid collection. Vascular: No hyperdense vessel. Skull: Soft tissue contusions with laceration present at the central forehead near the vertex an at the left supraorbital region. Scalp soft tissues otherwise within normal limits. Calvarium intact. Sinuses/Orbits: Globes and orbital soft tissues normal. Minimal mucosal thickening within the left sphenoid sinus. Paranasal sinuses are otherwise clear. No mastoid effusion. CT CERVICAL SPINE FINDINGS Alignment: Vertebral bodies normally aligned with preservation of the normal cervical lordosis. No listhesis. Skull base and vertebrae: Skullbase intact. Normal C1-2 articulations are preserved in the dens is intact. Vertebral body heights maintained. No acute fracture. Soft tissues and spinal canal: Soft tissues of the neck demonstrate no acute abnormality. No prevertebral edema. Disc levels: No significant degenerative changes within the cervical spine. Upper chest: Visualized upper chest within normal limits. Visualized lung apices are clear. No apical pneumothorax. IMPRESSION: CT BRAIN: 1. No acute intracranial process. 2. Multifocal scalp contusions with lacerations about the forehead. Calvarium intact. CT CERVICAL SPINE: No acute traumatic injury within the cervical spine. Electronically Signed   By: Jeannine Boga M.D.   On: 05/18/2017 05:59   Ct Abdomen Pelvis W Contrast  Result Date: 05/18/2017 CLINICAL DATA:  Initial evaluation for acute trauma, fall from 3 story building. EXAM: CT CHEST, ABDOMEN, AND PELVIS WITH CONTRAST TECHNIQUE: Multidetector CT imaging of the chest, abdomen and pelvis was performed following the standard protocol during bolus administration of intravenous contrast.  CONTRAST:  <See Chart> ISOVUE-300 IOPAMIDOL (ISOVUE-300) INJECTION 61% COMPARISON:  None. FINDINGS: CT CHEST FINDINGS Cardiovascular: Intrathoracic aorta of normal caliber and appearance without acute traumatic injury. Partially visualized great vessels within normal limits. Heart size  normal. No pericardial effusion. Limited evaluation of the pulmonary arterial tree unremarkable. Mediastinum/Nodes: Thyroid normal. No pathologically enlarged mediastinal, hilar, or axillary adenopathy. Esophagus within normal limits. No mediastinal hematoma or pneumomediastinum. Lungs/Pleura: Tracheobronchial tree intact and patent. Lungs are clear bilaterally. No focal infiltrates to suggest pneumonia or contusion. No pulmonary edema or pleural effusion. No pneumothorax. No worrisome pulmonary nodule or mass. Musculoskeletal: External soft tissues within normal limits. No acute osseus abnormality. No fracture. No worrisome lytic or blastic osseous lesions. CT ABDOMEN PELVIS FINDINGS Hepatobiliary: Liver demonstrates a normal contrast enhanced appearance. Gallbladder within normal limits. No biliary dilatation. Pancreas: Pancreas within normal limits. Spleen: Spleen within normal limits. Adrenals/Urinary Tract: Adrenal glands are normal. Kidneys equal size with symmetric enhancement. No nephrolithiasis, hydronephrosis, or focal enhancing renal mass. No hydroureter. Bladder within normal limits. Stomach/Bowel: Stomach within normal limits. No evidence for bowel obstruction or acute bowel injury. Appendix normal. No acute inflammatory changes seen about the bowels. Vascular/Lymphatic: Normal intravascular enhancement seen throughout the intra-abdominal aorta and its branch vessels. No adenopathy. Reproductive: Prostate normal. Other: No free air or fluid. No mesenteric or retroperitoneal hematoma. Musculoskeletal: External soft tissues demonstrate no acute abnormality. Acute proximal left femoral shaft fracture. Prior not able plate  screw fixation noted at the left femur, incompletely visualized. No other acute osseus abnormality. No worrisome lytic or blastic osseous lesions. IMPRESSION: 1. Acute proximal left femoral shaft fracture, better evaluated on prior radiograph. 2. No other acute traumatic injury within the chest, abdomen, and pelvis. Electronically Signed   By: Jeannine Boga M.D.   On: 05/18/2017 06:17   Dg Pelvis Portable  Result Date: 05/18/2017 CLINICAL DATA:  Status post fall from third story of building. Concern for pelvic injury. Initial encounter. EXAM: PORTABLE PELVIS 1-2 VIEWS COMPARISON:  None. FINDINGS: There is a displaced fracture of the proximal left femoral diaphysis, at the proximal edge of the patient's left femoral plate. Associated shortening and medial angulation noted. The femoral heads remain seated at their respective acetabula. The sacroiliac joints are grossly unremarkable in appearance. The visualized bowel gas pattern is grossly unremarkable. IMPRESSION: Displaced fracture of the proximal left femoral diaphysis, at the proximal edge of the patient's left femoral plate. Associated shortening and medial angulation. Electronically Signed   By: Garald Balding M.D.   On: 05/18/2017 05:38   Dg Chest Port 1 View  Result Date: 05/18/2017 CLINICAL DATA:  Status post fall from third story. Initial encounter. EXAM: PORTABLE CHEST 1 VIEW COMPARISON:  None. FINDINGS: The lungs are well-aerated and clear. There is no evidence of focal opacification, pleural effusion or pneumothorax. The cardiomediastinal silhouette is within normal limits. No acute osseous abnormalities are seen. IMPRESSION: No displaced rib fractures seen. No acute cardiopulmonary process identified. Electronically Signed   By: Garald Balding M.D.   On: 05/18/2017 05:36   Dg Femur Min 2 Views Left  Result Date: 05/18/2017 CLINICAL DATA:  Status post fall from third story of building, with left femur pain. Initial encounter. EXAM: LEFT  FEMUR 2 VIEWS COMPARISON:  None. FINDINGS: There is a displaced oblique fracture extending across the proximal diaphysis of the left femur, along the proximal edge of the patient's left femoral plate. There is associated shortening, medial angulation and rotation. No additional fractures are seen. The distal left femur appears intact. No knee joint effusion is identified. The left femoral head remains seated at the acetabulum. IMPRESSION: Displaced oblique fracture extending across the proximal diaphysis of the left femur, along the proximal edge of the patient's  left femoral plate. Associated shortening, medial angulation and rotation. Electronically Signed   By: Garald Balding M.D.   On: 05/18/2017 06:17    ROS:NO RECENT ILLNESSES OR HOSPITALIZATIONS Blood pressure 138/63, pulse 88, temperature 99.4 F (37.4 C), temperature source Oral, resp. rate 16, height '5\' 9"'  (1.753 m), weight 120 lb (54.4 kg), SpO2 99 %. Physical Exam  General Appearance:  Alert, cooperative, no distress, appears stated age  Head:    Eyes:          Throat:   Neck:      Lungs:   respirations unlabored  Chest Wall:  No tenderness or deformity  Heart:  Regular rate and rhythm,  Abdomen:   Soft, non-tender,         Extremities: RUE: SPLINT IN PLACE FINGERS WARM WELL PERFUSED OBVIOUS DEFORMITY TO WRIST GOOD CAPILLARY REFIL  Pulses:   Skin:      Neurologic:     Assessment/Plan: RIGHT WRIST INTRA-ARTICULAR DISTAL RADIUS FRACTURE DISPLACED  RIGHT WRIST OPEN REDUCTION AND INTERNAL FIXATION AND REPAIR AS INDICATED  R/B/A DISCUSSED WITH PT IN HOSPITAL.  PT VOICED UNDERSTANDING OF PLAN CONSENT SIGNED DAY OF SURGERY PT SEEN AND EXAMINED PRIOR TO OPERATIVE PROCEDURE/DAY OF SURGERY SITE MARKED. QUESTIONS ANSWERED WILL BE AN INPATIENT ON TRAUMA SERVICE FOLLOWING SURGERY  WE ARE PLANNING SURGERY FOR YOUR UPPER EXTREMITY. THE RISKS AND BENEFITS OF SURGERY INCLUDE BUT NOT LIMITED TO BLEEDING INFECTION, DAMAGE TO NEARBY  NERVES ARTERIES TENDONS, FAILURE OF SURGERY TO ACCOMPLISH ITS INTENDED GOALS, PERSISTENT SYMPTOMS AND NEED FOR FURTHER SURGICAL INTERVENTION. WITH THIS IN MIND WE WILL PROCEED. I HAVE DISCUSSED WITH THE PATIENT THE PRE AND POSTOPERATIVE REGIMEN AND THE DOS AND DON'TS. PT VOICED UNDERSTANDING AND INFORMED CONSENT SIGNED.  Linna Hoff 05/18/2017, 9:05 AM

## 2017-05-18 NOTE — Progress Notes (Signed)
RN notified Trauma of patient attempting suicide by smothering himself with blankets. Patient states that he does not want to live anymore. Patient also attempting to take off his oxygen. Patients family is very emotional by seeing this. MD ordered suicide sitter. Nursing will continue to monitor.

## 2017-05-18 NOTE — Progress Notes (Signed)
Radiology called for post op Xrays- awaiting arrival

## 2017-05-18 NOTE — Progress Notes (Signed)
Pt's father called by C. Jani Moronta, RN on arrival to phone per pt request- spoke with pt's father who stated he had Chris's phone- family updated & phone given to pt to speak with father. Pt awaiting xrays- DrHandy updated on Xray status

## 2017-05-18 NOTE — Progress Notes (Signed)
Orthopedic Tech Progress Note Patient Details:  Gerald Munoz Gerald Munoz [birth year] [id number]  Ortho Devices Ortho Device/Splint Location: applied ohf to bed Ortho Device/Splint Interventions: Ordered, Application   Jennye MoccasinHughes, Jaquelynn Wanamaker Craig 05/18/2017, 7:23 PM

## 2017-05-18 NOTE — H&P (Signed)
Gerald Munoz is an [age] y.o. male.   Chief Complaint: fall HPI: 89 yom who for some reason jumped from a third story balcony.  Right arm and left thigh hurt.  Remembers event. No other complaints  History reviewed. No pertinent past medical history.  PSH: left femur orif as a child  No family history on file. Social History:  reports that he has been smoking.  He has never used smokeless tobacco. He reports that he drinks alcohol. He reports that he uses drugs.  Allergies: No Known Allergies  meds none  Results for orders placed or performed during the hospital encounter of 05/18/17 (from the past 48 hour(s))  Type and screen     Status: None (Preliminary result)   Collection Time: 05/18/17  4:53 AM  Result Value Ref Range   ABO/RH(D) PENDING    Antibody Screen PENDING    Sample Expiration 05/21/2017    Unit Number [id number]    Blood Component Type RBC LR PHER2    Unit division 00    Status of Unit ISSUED    Unit tag comment VERBAL ORDERS PER DR Bebe ShaggyWICKLINE    Transfusion Status OK TO TRANSFUSE    Crossmatch Result PENDING    Unit Number [id number]    Blood Component Type RED CELLS,LR    Unit division 00    Status of Unit ISSUED    Unit tag comment VERBAL ORDERS PER DR Bebe ShaggyWICKLINE    Transfusion Status OK TO TRANSFUSE    Crossmatch Result PENDING   Prepare fresh frozen plasma     Status: None (Preliminary result)   Collection Time: 05/18/17  4:53 AM  Result Value Ref Range   Unit Number [id number]    Blood Component Type THAWED PLASMA    Unit division 00    Status of Unit ISSUED    Unit tag comment VERBAL ORDERS PER DR Bebe ShaggyWICKLINE    Transfusion Status OK TO TRANSFUSE    Unit Number [id number]    Blood Component Type THAWED PLASMA    Unit division 00    Status of Unit ISSUED    Unit tag comment VERBAL ORDERS PER DR Bebe ShaggyWICKLINE    Transfusion Status OK TO TRANSFUSE    No results found.  Review of Systems  Musculoskeletal: Positive for joint pain (right wrist  and left femur).    Blood pressure (!) 128/92, pulse 78, resp. rate 16, height 5\' 9"  (1.753 m), weight 54.4 kg (120 lb), SpO2 100 %. Physical Exam  Vitals reviewed. Constitutional: He is oriented to person, place, and time. He appears well-developed and well-nourished. He appears distressed.  HENT:  Head: Normocephalic. Head is with abrasion.  Eyes: Pupils are equal, round, and reactive to light. EOM are normal. No scleral icterus.  Neck: Neck supple. No spinous process tenderness and no muscular tenderness present. No tracheal deviation present.  Cardiovascular: Normal rate, regular rhythm, normal heart sounds and intact distal pulses.   Respiratory: Effort normal and breath sounds normal. He has no wheezes. He has no rales. He exhibits no tenderness.  GI: Soft. Bowel sounds are normal. There is no tenderness.  Genitourinary: Penis normal.  Musculoskeletal: He exhibits deformity (right fa and left femur).  Lymphadenopathy:    He has no cervical adenopathy.  Neurological: He is alert and oriented to person, place, and time.  Skin: Skin is warm and dry. He is not diaphoretic.     Assessment/Plan Fall  Ct head/cspine/abd/pelvis all normal I removed his c collar with negative  ct scan and normal exam in awake patient Right wrist fx- ortho consult Left femur fx- Dr Charlann Boxerlin seeing patient Lovenox, Devoria Glassingscds Inez Rosato, MD 05/18/2017, 5:19 AM

## 2017-05-18 NOTE — Anesthesia Postprocedure Evaluation (Signed)
Anesthesia Post Note  Patient: Gerald MingsChristopher Albert Munoz  Procedure(s) Performed: Procedure(s) (LRB): OPEN REDUCTION INTERNAL FIXATION (ORIF) WRIST FRACTURE (Right) INTRAMEDULLARY (IM) NAIL INTERTROCHANTRIC (Left) HARDWARE REMOVAL     Patient location during evaluation: PACU Anesthesia Type: General Level of consciousness: awake Pain management: pain level controlled Vital Signs Assessment: post-procedure vital signs reviewed and stable Respiratory status: spontaneous breathing Cardiovascular status: stable Postop Assessment: no signs of nausea or vomiting Anesthetic complications: no    Last Vitals:  Vitals:   05/18/17 1805 05/18/17 1830  BP: 139/82 (!) 143/61  Pulse: 78 78  Resp:  20  Temp:  36.5 C    Last Pain:  Vitals:   05/18/17 1830  TempSrc: Axillary  PainSc:                  Mao Lockner

## 2017-05-18 NOTE — Consult Note (Signed)
Orthopaedic Trauma Service (OTS) Consult   Reason for Consult: Left subtrochanteric peri-implant femur fracture  Referring Physician: Donne Hazel, MD (trauma)   HPI: Gerald Munoz is an 21 y.o. black male who jumped from a the 3rd or 4th story of an apartment complex for som reason. Immediate onset of pain in R wrist and left leg. Inability to bear weight on left leg. Notes increasing pain and tingling in his R wrist. L leg does not hurt too bad at this time. Pt currently in OR holding  Denies numbness or tingling in L leg  Proximal femoral plate on left femur done when pt was in 3rd grade  States he dislocated Left knee 2 years ago, reduced at hospital. No apparent problems since then   History reviewed. No pertinent past medical history.   No active medical problems   Past Surgical History:  Procedure Laterality Date  . FEMUR FRACTURE SURGERY     ORIF left proximal femur in 3rd grade   . KNEE SURGERY     reduction of knee dislocation     History reviewed. No pertinent family history.  Social History:  reports that he has been smoking.  He has never used smokeless tobacco. He reports that he drinks alcohol. He reports that he uses drugs.  Allergies: No Known Allergies  Medications:  I have reviewed the patient's current medications. Prior to Admission:  No prescriptions prior to admission.    Results for orders placed or performed during the hospital encounter of 05/18/17 (from the past 48 hour(s))  Type and screen     Status: None   Collection Time: 05/18/17  5:30 AM  Result Value Ref Range   ABO/RH(D) O POS    Antibody Screen NEG    Sample Expiration 05/21/2017    Unit Number [id number]    Blood Component Type RBC LR PHER2    Unit division 00    Status of Unit REL FROM [facility]    Unit tag comment VERBAL ORDERS PER DR Christy Gentles    Transfusion Status OK TO TRANSFUSE    Crossmatch Result NOT NEEDED    Unit Number [id number]    Blood Component Type RED  CELLS,LR    Unit division 00    Status of Unit REL FROM [facility]    Unit tag comment VERBAL ORDERS PER DR Christy Gentles    Transfusion Status OK TO TRANSFUSE    Crossmatch Result NOT NEEDED   Prepare fresh frozen plasma     Status: None   Collection Time: 05/18/17  5:30 AM  Result Value Ref Range   Unit Number [id number]    Blood Component Type THAWED PLASMA    Unit division 00    Status of Unit REL FROM [facility]    Unit tag comment VERBAL ORDERS PER DR Christy Gentles    Transfusion Status OK TO TRANSFUSE    Unit Number [id number]    Blood Component Type THAWED PLASMA    Unit division 00    Status of Unit REL FROM [facility]    Unit tag comment VERBAL ORDERS PER DR Christy Gentles    Transfusion Status OK TO TRANSFUSE   CDS serology     Status: None   Collection Time: 05/18/17  5:30 AM  Result Value Ref Range   CDS serology specimen STAT   Comprehensive metabolic panel     Status: Abnormal   Collection Time: 05/18/17  5:30 AM  Result Value Ref Range   Sodium 139 135 - 145 mmol/L  Potassium 3.1 (L) 3.5 - 5.1 mmol/L   Chloride 109 101 - 111 mmol/L   CO2 21 (L) 22 - 32 mmol/L   Glucose, Bld 100 (H) 65 - 99 mg/dL   BUN 11 6 - 20 mg/dL   Creatinine, Ser 0.93 0.61 - 1.24 mg/dL   Calcium 8.6 (L) 8.9 - 10.3 mg/dL   Total Protein 6.6 6.5 - 8.1 g/dL   Albumin 4.2 3.5 - 5.0 g/dL   AST 40 15 - 41 U/L   ALT 21 17 - 63 U/L   Alkaline Phosphatase 42 38 - 126 U/L   Total Bilirubin 0.7 0.3 - 1.2 mg/dL   GFR calc non Af Amer >60 >60 mL/min   GFR calc Af Amer >60 >60 mL/min    Comment: (NOTE) The eGFR has been calculated using the CKD EPI equation. This calculation has not been validated in all clinical situations. eGFR's persistently <60 mL/min signify possible Chronic Kidney Disease.    Anion gap 9 5 - 15  CBC     Status: Abnormal   Collection Time: 05/18/17  5:30 AM  Result Value Ref Range   WBC 8.7 4.0 - 10.5 K/uL   RBC 4.36 4.22 - 5.81 MIL/uL   Hemoglobin 13.1 13.0 - 17.0 g/dL   HCT 37.3 (L)  39.0 - 52.0 %   MCV 85.6 78.0 - 100.0 fL   MCH 30.0 26.0 - 34.0 pg   MCHC 35.1 30.0 - 36.0 g/dL   RDW 12.1 11.5 - 15.5 %   Platelets 224 150 - 400 K/uL  Ethanol     Status: Abnormal   Collection Time: 05/18/17  5:30 AM  Result Value Ref Range   Alcohol, Ethyl (B) 78 (H) <5 mg/dL    Comment:        LOWEST DETECTABLE LIMIT FOR SERUM ALCOHOL IS 5 mg/dL FOR MEDICAL PURPOSES ONLY   Protime-INR     Status: None   Collection Time: 05/18/17  5:30 AM  Result Value Ref Range   Prothrombin Time 13.3 11.4 - 15.2 seconds   INR 1.01   ABO/Rh     Status: None   Collection Time: 05/18/17  5:30 AM  Result Value Ref Range   ABO/RH(D) O POS   I-Stat Chem 8, ED     Status: Abnormal   Collection Time: 05/18/17  5:38 AM  Result Value Ref Range   Sodium 144 135 - 145 mmol/L   Potassium 3.3 (L) 3.5 - 5.1 mmol/L   Chloride 110 101 - 111 mmol/L   BUN 12 6 - 20 mg/dL   Creatinine, Ser 0.90 0.61 - 1.24 mg/dL   Glucose, Bld 94 65 - 99 mg/dL   Calcium, Ion 1.11 (L) 1.15 - 1.40 mmol/L   TCO2 23 0 - 100 mmol/L   Hemoglobin 13.6 13.0 - 17.0 g/dL   HCT 40.0 39.0 - 52.0 %  I-Stat CG4 Lactic Acid, ED     Status: Abnormal   Collection Time: 05/18/17  5:38 AM  Result Value Ref Range   Lactic Acid, Venous 3.72 (HH) 0.5 - 1.9 mmol/L   Comment NOTIFIED PHYSICIAN   Urinalysis, Routine w reflex microscopic     Status: Abnormal   Collection Time: 05/18/17  7:10 AM  Result Value Ref Range   Color, Urine YELLOW YELLOW   APPearance CLEAR CLEAR   Specific Gravity, Urine 1.010 1.005 - 1.030   pH 7.0 5.0 - 8.0   Glucose, UA NEGATIVE NEGATIVE mg/dL   Hgb urine dipstick  TRACE (A) NEGATIVE   Bilirubin Urine NEGATIVE NEGATIVE   Ketones, ur NEGATIVE NEGATIVE mg/dL   Protein, ur NEGATIVE NEGATIVE mg/dL   Nitrite NEGATIVE NEGATIVE   Leukocytes, UA NEGATIVE NEGATIVE  Urinalysis, Microscopic (reflex)     Status: None   Collection Time: 05/18/17  7:10 AM  Result Value Ref Range   RBC / HPF NONE SEEN 0 - 5 RBC/hpf    WBC, UA 0-5 0 - 5 WBC/hpf   Bacteria, UA NONE SEEN NONE SEEN   Squamous Epithelial / LPF NONE SEEN NONE SEEN    Dg Wrist Complete Right  Result Date: 05/18/2017 CLINICAL DATA:  Status post fall from third history of building, with right wrist pain. Initial encounter. EXAM: RIGHT WRIST - COMPLETE 3+ VIEW COMPARISON:  None. FINDINGS: There is a comminuted and significantly displaced fracture of the distal radius, with marked dorsal and radial displacement, and shortening, and surrounding soft tissue swelling. There also appears to be a displaced ulnar styloid fracture. The carpal rows articulate with the displaced distal radial fragments. The carpal rows appear grossly intact. IMPRESSION: Comminuted and significantly displaced fracture of the distal radius, with marked dorsal and radial displacement, and shortening. Displaced ulnar styloid fracture. Electronically Signed   By: Garald Balding M.D.   On: 05/18/2017 06:18   Ct Head Wo Contrast  Result Date: 05/18/2017 CLINICAL DATA:  Initial evaluation for acute trauma. EXAM: CT HEAD WITHOUT CONTRAST CT CERVICAL SPINE WITHOUT CONTRAST TECHNIQUE: Multidetector CT imaging of the head and cervical spine was performed following the standard protocol without intravenous contrast. Multiplanar CT image reconstructions of the cervical spine were also generated. COMPARISON:  None. FINDINGS: CT HEAD FINDINGS Brain: Study degraded by motion. Cerebral volume normal. No acute intracranial hemorrhage or infarct. No mass lesion, midline shift or mass effect. No hydrocephalus. No extra-axial fluid collection. Vascular: No hyperdense vessel. Skull: Soft tissue contusions with laceration present at the central forehead near the vertex an at the left supraorbital region. Scalp soft tissues otherwise within normal limits. Calvarium intact. Sinuses/Orbits: Globes and orbital soft tissues normal. Minimal mucosal thickening within the left sphenoid sinus. Paranasal sinuses are  otherwise clear. No mastoid effusion. CT CERVICAL SPINE FINDINGS Alignment: Vertebral bodies normally aligned with preservation of the normal cervical lordosis. No listhesis. Skull base and vertebrae: Skullbase intact. Normal C1-2 articulations are preserved in the dens is intact. Vertebral body heights maintained. No acute fracture. Soft tissues and spinal canal: Soft tissues of the neck demonstrate no acute abnormality. No prevertebral edema. Disc levels: No significant degenerative changes within the cervical spine. Upper chest: Visualized upper chest within normal limits. Visualized lung apices are clear. No apical pneumothorax. IMPRESSION: CT BRAIN: 1. No acute intracranial process. 2. Multifocal scalp contusions with lacerations about the forehead. Calvarium intact. CT CERVICAL SPINE: No acute traumatic injury within the cervical spine. Electronically Signed   By: Jeannine Boga M.D.   On: 05/18/2017 05:59   Ct Chest W Contrast  Result Date: 05/18/2017 CLINICAL DATA:  Initial evaluation for acute trauma, fall from 3 story building. EXAM: CT CHEST, ABDOMEN, AND PELVIS WITH CONTRAST TECHNIQUE: Multidetector CT imaging of the chest, abdomen and pelvis was performed following the standard protocol during bolus administration of intravenous contrast. CONTRAST:  <See Chart> ISOVUE-300 IOPAMIDOL (ISOVUE-300) INJECTION 61% COMPARISON:  None. FINDINGS: CT CHEST FINDINGS Cardiovascular: Intrathoracic aorta of normal caliber and appearance without acute traumatic injury. Partially visualized great vessels within normal limits. Heart size normal. No pericardial effusion. Limited evaluation of the pulmonary arterial  tree unremarkable. Mediastinum/Nodes: Thyroid normal. No pathologically enlarged mediastinal, hilar, or axillary adenopathy. Esophagus within normal limits. No mediastinal hematoma or pneumomediastinum. Lungs/Pleura: Tracheobronchial tree intact and patent. Lungs are clear bilaterally. No focal  infiltrates to suggest pneumonia or contusion. No pulmonary edema or pleural effusion. No pneumothorax. No worrisome pulmonary nodule or mass. Musculoskeletal: External soft tissues within normal limits. No acute osseus abnormality. No fracture. No worrisome lytic or blastic osseous lesions. CT ABDOMEN PELVIS FINDINGS Hepatobiliary: Liver demonstrates a normal contrast enhanced appearance. Gallbladder within normal limits. No biliary dilatation. Pancreas: Pancreas within normal limits. Spleen: Spleen within normal limits. Adrenals/Urinary Tract: Adrenal glands are normal. Kidneys equal size with symmetric enhancement. No nephrolithiasis, hydronephrosis, or focal enhancing renal mass. No hydroureter. Bladder within normal limits. Stomach/Bowel: Stomach within normal limits. No evidence for bowel obstruction or acute bowel injury. Appendix normal. No acute inflammatory changes seen about the bowels. Vascular/Lymphatic: Normal intravascular enhancement seen throughout the intra-abdominal aorta and its branch vessels. No adenopathy. Reproductive: Prostate normal. Other: No free air or fluid. No mesenteric or retroperitoneal hematoma. Musculoskeletal: External soft tissues demonstrate no acute abnormality. Acute proximal left femoral shaft fracture. Prior not able plate screw fixation noted at the left femur, incompletely visualized. No other acute osseus abnormality. No worrisome lytic or blastic osseous lesions. IMPRESSION: 1. Acute proximal left femoral shaft fracture, better evaluated on prior radiograph. 2. No other acute traumatic injury within the chest, abdomen, and pelvis. Electronically Signed   By: Jeannine Boga M.D.   On: 05/18/2017 06:17   Ct Cervical Spine Wo Contrast  Result Date: 05/18/2017 CLINICAL DATA:  Initial evaluation for acute trauma. EXAM: CT HEAD WITHOUT CONTRAST CT CERVICAL SPINE WITHOUT CONTRAST TECHNIQUE: Multidetector CT imaging of the head and cervical spine was performed  following the standard protocol without intravenous contrast. Multiplanar CT image reconstructions of the cervical spine were also generated. COMPARISON:  None. FINDINGS: CT HEAD FINDINGS Brain: Study degraded by motion. Cerebral volume normal. No acute intracranial hemorrhage or infarct. No mass lesion, midline shift or mass effect. No hydrocephalus. No extra-axial fluid collection. Vascular: No hyperdense vessel. Skull: Soft tissue contusions with laceration present at the central forehead near the vertex an at the left supraorbital region. Scalp soft tissues otherwise within normal limits. Calvarium intact. Sinuses/Orbits: Globes and orbital soft tissues normal. Minimal mucosal thickening within the left sphenoid sinus. Paranasal sinuses are otherwise clear. No mastoid effusion. CT CERVICAL SPINE FINDINGS Alignment: Vertebral bodies normally aligned with preservation of the normal cervical lordosis. No listhesis. Skull base and vertebrae: Skullbase intact. Normal C1-2 articulations are preserved in the dens is intact. Vertebral body heights maintained. No acute fracture. Soft tissues and spinal canal: Soft tissues of the neck demonstrate no acute abnormality. No prevertebral edema. Disc levels: No significant degenerative changes within the cervical spine. Upper chest: Visualized upper chest within normal limits. Visualized lung apices are clear. No apical pneumothorax. IMPRESSION: CT BRAIN: 1. No acute intracranial process. 2. Multifocal scalp contusions with lacerations about the forehead. Calvarium intact. CT CERVICAL SPINE: No acute traumatic injury within the cervical spine. Electronically Signed   By: Jeannine Boga M.D.   On: 05/18/2017 05:59   Ct Abdomen Pelvis W Contrast  Result Date: 05/18/2017 CLINICAL DATA:  Initial evaluation for acute trauma, fall from 3 story building. EXAM: CT CHEST, ABDOMEN, AND PELVIS WITH CONTRAST TECHNIQUE: Multidetector CT imaging of the chest, abdomen and pelvis was  performed following the standard protocol during bolus administration of intravenous contrast. CONTRAST:  <See Chart> ISOVUE-300  IOPAMIDOL (ISOVUE-300) INJECTION 61% COMPARISON:  None. FINDINGS: CT CHEST FINDINGS Cardiovascular: Intrathoracic aorta of normal caliber and appearance without acute traumatic injury. Partially visualized great vessels within normal limits. Heart size normal. No pericardial effusion. Limited evaluation of the pulmonary arterial tree unremarkable. Mediastinum/Nodes: Thyroid normal. No pathologically enlarged mediastinal, hilar, or axillary adenopathy. Esophagus within normal limits. No mediastinal hematoma or pneumomediastinum. Lungs/Pleura: Tracheobronchial tree intact and patent. Lungs are clear bilaterally. No focal infiltrates to suggest pneumonia or contusion. No pulmonary edema or pleural effusion. No pneumothorax. No worrisome pulmonary nodule or mass. Musculoskeletal: External soft tissues within normal limits. No acute osseus abnormality. No fracture. No worrisome lytic or blastic osseous lesions. CT ABDOMEN PELVIS FINDINGS Hepatobiliary: Liver demonstrates a normal contrast enhanced appearance. Gallbladder within normal limits. No biliary dilatation. Pancreas: Pancreas within normal limits. Spleen: Spleen within normal limits. Adrenals/Urinary Tract: Adrenal glands are normal. Kidneys equal size with symmetric enhancement. No nephrolithiasis, hydronephrosis, or focal enhancing renal mass. No hydroureter. Bladder within normal limits. Stomach/Bowel: Stomach within normal limits. No evidence for bowel obstruction or acute bowel injury. Appendix normal. No acute inflammatory changes seen about the bowels. Vascular/Lymphatic: Normal intravascular enhancement seen throughout the intra-abdominal aorta and its branch vessels. No adenopathy. Reproductive: Prostate normal. Other: No free air or fluid. No mesenteric or retroperitoneal hematoma. Musculoskeletal: External soft tissues  demonstrate no acute abnormality. Acute proximal left femoral shaft fracture. Prior not able plate screw fixation noted at the left femur, incompletely visualized. No other acute osseus abnormality. No worrisome lytic or blastic osseous lesions. IMPRESSION: 1. Acute proximal left femoral shaft fracture, better evaluated on prior radiograph. 2. No other acute traumatic injury within the chest, abdomen, and pelvis. Electronically Signed   By: Jeannine Boga M.D.   On: 05/18/2017 06:17   Dg Pelvis Portable  Result Date: 05/18/2017 CLINICAL DATA:  Status post fall from third story of building. Concern for pelvic injury. Initial encounter. EXAM: PORTABLE PELVIS 1-2 VIEWS COMPARISON:  None. FINDINGS: There is a displaced fracture of the proximal left femoral diaphysis, at the proximal edge of the patient's left femoral plate. Associated shortening and medial angulation noted. The femoral heads remain seated at their respective acetabula. The sacroiliac joints are grossly unremarkable in appearance. The visualized bowel gas pattern is grossly unremarkable. IMPRESSION: Displaced fracture of the proximal left femoral diaphysis, at the proximal edge of the patient's left femoral plate. Associated shortening and medial angulation. Electronically Signed   By: Garald Balding M.D.   On: 05/18/2017 05:38   Dg Chest Port 1 View  Result Date: 05/18/2017 CLINICAL DATA:  Status post fall from third story. Initial encounter. EXAM: PORTABLE CHEST 1 VIEW COMPARISON:  None. FINDINGS: The lungs are well-aerated and clear. There is no evidence of focal opacification, pleural effusion or pneumothorax. The cardiomediastinal silhouette is within normal limits. No acute osseous abnormalities are seen. IMPRESSION: No displaced rib fractures seen. No acute cardiopulmonary process identified. Electronically Signed   By: Garald Balding M.D.   On: 05/18/2017 05:36   Dg Femur Min 2 Views Left  Result Date: 05/18/2017 CLINICAL DATA:   Status post fall from third story of building, with left femur pain. Initial encounter. EXAM: LEFT FEMUR 2 VIEWS COMPARISON:  None. FINDINGS: There is a displaced oblique fracture extending across the proximal diaphysis of the left femur, along the proximal edge of the patient's left femoral plate. There is associated shortening, medial angulation and rotation. No additional fractures are seen. The distal left femur appears intact. No knee joint  effusion is identified. The left femoral head remains seated at the acetabulum. IMPRESSION: Displaced oblique fracture extending across the proximal diaphysis of the left femur, along the proximal edge of the patient's left femoral plate. Associated shortening, medial angulation and rotation. Electronically Signed   By: Garald Balding M.D.   On: 05/18/2017 06:17    Review of Systems  Constitutional: Negative for chills and fever.  Respiratory: Negative for shortness of breath.   Cardiovascular: Negative for chest pain and palpitations.  Gastrointestinal: Negative for abdominal pain and vomiting.  Musculoskeletal:       R wrist pain and Left hip pain    Blood pressure 138/63, pulse 88, temperature 99.4 F (37.4 C), temperature source Oral, resp. rate 16, height '5\' 9"'  (1.753 m), weight 54.4 kg (120 lb), SpO2 99 %. Physical Exam  Constitutional: He is cooperative.  21 y/o black male, appears appropriate for stated age   Cardiovascular: Normal rate, regular rhythm, S1 normal and S2 normal.   Pulmonary/Chest:  Clear anterior field    Abdominal:  Soft, NTND, + BS  Musculoskeletal:  Pelvis   no traumatic wounds or rash, no ecchymosis, stable to manual stress, nontender  Left Lower Extremity  Inspection:   Left leg is short and externally rotated   Healed surgical wound proximal anterolateral femur    No deformities about knee, ankle   Bony eval:    L hip and proximal femur TTP    L knee nontender     Left lower leg, ankle and foot are  nontender Soft tissue:    No significant swelling to L leg     No knee or ankle effusion     Did not stress knee due to femur fracture    Ankle is stable     No open wounds or lesions   ROM:    Excellent ROM L ankle      Sensation:    DPN, SPN, TN sensation grossly intact  Motor:    EHL, FHL, AT, PT, peroneals, gastroc motor intact  Vascular:    Ext warm     + DP pulse    Compartments are soft, no pain with passive stretch   Right Lower Extremity             No traumatic wounds, ecchymosis, or rash  Nontender             No pain with axial loading or log rolling of hip   No effusions  Knee stable to varus/ valgus and anterior/posterior stress  Sens DPN, SPN, TN intact  Motor EHL, ext, flex, evers 5/5  DP 2+, PT 2+, No significant edema   Right upper extremity      Per Dr. Caralyn Guile  Left upper extremity        shoulder, elbow, wrist, digits- no skin wounds, nontender, no instability, no blocks to motion  Sens  Ax/R/M/U intact  Mot   Ax/ R/ PIN/ M/ AIN/ U intact  Rad 2+    Neurological: He is alert.     Assessment/Plan:  21 y/o black male s/p jump from 3rd/4th story balcony with multiple ortho injuries   -Left subtrochanteric peri-implant femur fracture  OR for [facility]  IMN L femur  TDWB post op  No ROM restrictions   - R distal radius fracture   Per Dr. Caralyn Guile  - Pain management:  Per TS  - ABL anemia/Hemodynamics  Stable   Lactic acid 3.72 on admission    - DVT/PE  prophylaxis:  Lovenox/scds post op   - ID:   periop abx   - Dispo:  OR this am for repair of R wrist fractures and L femur fracture    Jari Pigg, PA-C [facility] [phone] (P) 05/18/2017, 9:23 AM

## 2017-05-18 NOTE — ED Notes (Signed)
Pt taken to CT with RN 

## 2017-05-18 NOTE — Op Note (Signed)
PREOPERATIVE DIAGNOSIS: Right wrist intra-articular distal radius fracture 3 or more fragments Impending compartment syndrome  POSTOPERATIVE DIAGNOSIS: Same  ATTENDING SURGEON: Dr. Gilman SchmidtFred Ortman who was scrubbed and present for the entire procedure  ASSISTANT SURGEON: None  ANESTHESIA: Gen. via endotracheal  OPERATIVE PROCEDURE: #1: Open treatment of right wrist intra-articular distal radius fracture 3 more fragments #2: Right wrist brachia radialis tendon release, tendon tenotomy #3: Radiographs 3 views right wrist #4: Right distal forearm forearm fasciotomy, volar superficial and deep volar compartments #5: Open debridement debridement of grade 1 open distal radius fracture  IMPLANTS: [employer] DVR cross lock standard  RADIOGRAPHIC INTERPRETATION: AP lateral oblique views the wrist to show the volar plate fixation place with good restoration of the radial height inclination and tilt  SURGICAL INDICATIONS: Right-hand-dominant gentleman who fell from an extended height. Patient was seen and evaluated having worsening pain and swelling is recommended he undergo the above procedure. Risks of surgery include but not limited to bleeding infection damage to nearby nerves arteries or tendons loss of motion of the wrists and digits incomplete relief of symptoms and need for further surgical intervention  SURGICAL TECHNIQUE: Patient is properly identified in the preoperative holding area marked with a permanent marker made on the right wrist indicate correct operative site. Patient is then brought back to the operating room placed supine on anesthesia and table where the general anesthesia was induced. Preoperative antibiotics were given prior to any skin incision. A well-padded tourniquet was then placed on the right brachium and sealed with a appropriate drape. The right upper extremities and prepped and draped in normal sterile fashion. The patient did have the grade 1 poke: Over the volar aspect of the  distal radius he also has some soft tissue abrasions over the ulnar aspect. The patient's compartment was very tense and swollen over the distal wrist region. He did have the impending compartment syndrome of the distal forearm. A FCR approach or use of the distal radius. The limb was elevated and a tourniquet insufflated. Dissection carried down through skin subtendinous tissue. The volar compartment was then released both proximally and distally forearm fasciotomy distally was carried out patient had a very tense compartments. Deep dissection carried all the way down to the pronator quadratus which was elevated off the distal radius. This exposed the highly comminuted greater than 3 part fracture. The patient did have the volar wound and thorough wound irrigation excisional debridement of skin subtendinous tissue all the way down the bone was then carried out removing the devitalized bone. The after open treatment of the fracture site fracture was then reduced. In order reduce the radial column and get the radius back out to the appropriate length careful brachial radialis was was released off the radial styloid. Protection of the first dorsal compartment tendons was done. The wound was then thoroughly irrigated. After thorough wound irrigation tendon tenotomy open reduction have been performed and the volar plate was then applied. Screw fixation was then carried out from a ulnar to radial direction with a combination locking and nonlocking screws. Oblong screw hole was then placed proximally the plate Heidemann adjusted appropriately in the shaft fixation was appropriately done. The wound was then thoroughly irrigated. Final radiographs then obtained. The pronator quadratus was then closed with 2-0 Vicryl subcutaneous tissues closed with 4-0 Vicryl and skin closed with 4-0 Vicryl repeat. Xeroform dressing a sterile compressive dressing applied. Patient tolerated the procedure well was placed in well-padded sugar  tong splint. Patient was then kept on  the same bed where the patient's contralateral femur was being worked on by Dr. Carola FrostHandy separate report for this separate procedure.  POSTOPERATIVE PLAN: Patient is admitted back to the trauma service continue with a sugar tong splint for 2 weeks of seen back now office in 2 weeks for wound check application of a short arm cast for a total of 3 weeks immobilization for 5 weeks and then begin a gradual use and activity. Radiographs at each visit.

## 2017-05-18 NOTE — ED Notes (Signed)
Called OR.  Pt is not on schedule yet.

## 2017-05-18 NOTE — ED Provider Notes (Signed)
[facility]-EMERGENCY DEPT Provider Note   CSN: [id number] Arrival date & time: 05/18/17  0459     History   Chief Complaint Chief Complaint  Patient presents with  . Fall  LEVEL 5 CAVEAT DUE TO ACUITY OF CONDITION   HPI Gerald Munoz is a 21 y.o. male.  The history is provided by the patient and the EMS personnel. The history is limited by the condition of the patient.  Fall  This is a new problem. Episode onset: UNKNOWN. The problem occurs constantly. The problem has been rapidly worsening. Pertinent negatives include no chest pain and no abdominal pain. Exacerbated by: MOVEMENT. The symptoms are relieved by rest.  pt presents as level 1 trauma for fall from "45 feet" per EMS Unknown why patient fell Pt has pain in right wrist and his left femur with obvious deformities Otherwise pt is awake/alert No other details are known at arrival   PMH - UNKNOWN SOC HX - ETOH USE Home Medications    Prior to Admission medications   Not on File    Family History No family history on file.  Social History Social History  Substance Use Topics  . Smoking status: Current Every Day Smoker  . Smokeless tobacco: Never Used  . Alcohol use Yes     Allergies   Patient has no known allergies.   Review of Systems Review of Systems  Unable to perform ROS: Acuity of condition  Cardiovascular: Negative for chest pain.  Gastrointestinal: Negative for abdominal pain.     Physical Exam Updated Vital Signs BP (!) 128/92   Pulse 78   Resp 16   Ht 1.753 m (5\' 9" )   Wt 54.4 kg (120 lb)   SpO2 100%   BMI 17.72 kg/m   Physical Exam CONSTITUTIONAL: mildly anxious, smells of ETOH HEAD: Normocephalic/atraumatic EYES: EOMI/PERRL ENMT: Mucous membranes moist, abrasions to face, no dental injury, no facial instability NECK: cervical collar in place SPINE/BACK:no cervical spine tenderness, Patient maintained in spinal precautions/logroll utilized CV: S1/S2 noted, no  murmurs/rubs/gallops noted LUNGS: Lungs are clear to auscultation bilaterally, no apparent distress Chest - no tenderness, no deformity ABDOMEN: soft, mild diffuse tenderness GU:no cva tenderness NEURO: Pt is awake/alert/appropriate, moves all extremitiesx4.  GCS 15 EXTREMITIES: pulses normal/equalx4,obvious deformity to left hip.  Obvious deformity to right wrist with small abrasion noted.  Pelvis stable All other extremities/joints palpated/ranged and nontender SKIN: warm, color normal PSYCH: anxious  ED Treatments / Results  Labs (all labs ordered are listed, but only abnormal results are displayed) Labs Reviewed  COMPREHENSIVE METABOLIC PANEL - Abnormal; Notable for the following:       Result Value   Potassium 3.1 (*)    CO2 21 (*)    Glucose, Bld 100 (*)    Calcium 8.6 (*)    All other components within normal limits  CBC - Abnormal; Notable for the following:    HCT 37.3 (*)    All other components within normal limits  ETHANOL - Abnormal; Notable for the following:    Alcohol, Ethyl (B) 78 (*)    All other components within normal limits  I-STAT CHEM 8, ED - Abnormal; Notable for the following:    Potassium 3.3 (*)    Calcium, Ion 1.11 (*)    All other components within normal limits  I-STAT CG4 LACTIC ACID, ED - Abnormal; Notable for the following:    Lactic Acid, Venous 3.72 (*)    All other components within normal limits  CDS  SEROLOGY  PROTIME-INR  URINALYSIS, ROUTINE W REFLEX MICROSCOPIC  TYPE AND SCREEN  PREPARE FRESH FROZEN PLASMA  ABO/RH    EKG  EKG Interpretation None       Radiology Dg Wrist Complete Right  Result Date: 05/18/2017 CLINICAL DATA:  Status post fall from third history of building, with right wrist pain. Initial encounter. EXAM: RIGHT WRIST - COMPLETE 3+ VIEW COMPARISON:  None. FINDINGS: There is a comminuted and significantly displaced fracture of the distal radius, with marked dorsal and radial displacement, and shortening, and  surrounding soft tissue swelling. There also appears to be a displaced ulnar styloid fracture. The carpal rows articulate with the displaced distal radial fragments. The carpal rows appear grossly intact. IMPRESSION: Comminuted and significantly displaced fracture of the distal radius, with marked dorsal and radial displacement, and shortening. Displaced ulnar styloid fracture. Electronically Signed   By: Roanna RaiderJeffery  Chang M.D.   On: 05/18/2017 06:18   Ct Head Wo Contrast  Result Date: 05/18/2017 CLINICAL DATA:  Initial evaluation for acute trauma. EXAM: CT HEAD WITHOUT CONTRAST CT CERVICAL SPINE WITHOUT CONTRAST TECHNIQUE: Multidetector CT imaging of the head and cervical spine was performed following the standard protocol without intravenous contrast. Multiplanar CT image reconstructions of the cervical spine were also generated. COMPARISON:  None. FINDINGS: CT HEAD FINDINGS Brain: Study degraded by motion. Cerebral volume normal. No acute intracranial hemorrhage or infarct. No mass lesion, midline shift or mass effect. No hydrocephalus. No extra-axial fluid collection. Vascular: No hyperdense vessel. Skull: Soft tissue contusions with laceration present at the central forehead near the vertex an at the left supraorbital region. Scalp soft tissues otherwise within normal limits. Calvarium intact. Sinuses/Orbits: Globes and orbital soft tissues normal. Minimal mucosal thickening within the left sphenoid sinus. Paranasal sinuses are otherwise clear. No mastoid effusion. CT CERVICAL SPINE FINDINGS Alignment: Vertebral bodies normally aligned with preservation of the normal cervical lordosis. No listhesis. Skull base and vertebrae: Skullbase intact. Normal C1-2 articulations are preserved in the dens is intact. Vertebral body heights maintained. No acute fracture. Soft tissues and spinal canal: Soft tissues of the neck demonstrate no acute abnormality. No prevertebral edema. Disc levels: No significant degenerative  changes within the cervical spine. Upper chest: Visualized upper chest within normal limits. Visualized lung apices are clear. No apical pneumothorax. IMPRESSION: CT BRAIN: 1. No acute intracranial process. 2. Multifocal scalp contusions with lacerations about the forehead. Calvarium intact. CT CERVICAL SPINE: No acute traumatic injury within the cervical spine. Electronically Signed   By: Rise MuBenjamin  McClintock M.D.   On: 05/18/2017 05:59   Ct Chest W Contrast  Result Date: 05/18/2017 CLINICAL DATA:  Initial evaluation for acute trauma, fall from 3 story building. EXAM: CT CHEST, ABDOMEN, AND PELVIS WITH CONTRAST TECHNIQUE: Multidetector CT imaging of the chest, abdomen and pelvis was performed following the standard protocol during bolus administration of intravenous contrast. CONTRAST:  <See Chart> ISOVUE-300 IOPAMIDOL (ISOVUE-300) INJECTION 61% COMPARISON:  None. FINDINGS: CT CHEST FINDINGS Cardiovascular: Intrathoracic aorta of normal caliber and appearance without acute traumatic injury. Partially visualized great vessels within normal limits. Heart size normal. No pericardial effusion. Limited evaluation of the pulmonary arterial tree unremarkable. Mediastinum/Nodes: Thyroid normal. No pathologically enlarged mediastinal, hilar, or axillary adenopathy. Esophagus within normal limits. No mediastinal hematoma or pneumomediastinum. Lungs/Pleura: Tracheobronchial tree intact and patent. Lungs are clear bilaterally. No focal infiltrates to suggest pneumonia or contusion. No pulmonary edema or pleural effusion. No pneumothorax. No worrisome pulmonary nodule or mass. Musculoskeletal: External soft tissues within normal limits. No acute  osseus abnormality. No fracture. No worrisome lytic or blastic osseous lesions. CT ABDOMEN PELVIS FINDINGS Hepatobiliary: Liver demonstrates a normal contrast enhanced appearance. Gallbladder within normal limits. No biliary dilatation. Pancreas: Pancreas within normal limits.  Spleen: Spleen within normal limits. Adrenals/Urinary Tract: Adrenal glands are normal. Kidneys equal size with symmetric enhancement. No nephrolithiasis, hydronephrosis, or focal enhancing renal mass. No hydroureter. Bladder within normal limits. Stomach/Bowel: Stomach within normal limits. No evidence for bowel obstruction or acute bowel injury. Appendix normal. No acute inflammatory changes seen about the bowels. Vascular/Lymphatic: Normal intravascular enhancement seen throughout the intra-abdominal aorta and its branch vessels. No adenopathy. Reproductive: Prostate normal. Other: No free air or fluid. No mesenteric or retroperitoneal hematoma. Musculoskeletal: External soft tissues demonstrate no acute abnormality. Acute proximal left femoral shaft fracture. Prior not able plate screw fixation noted at the left femur, incompletely visualized. No other acute osseus abnormality. No worrisome lytic or blastic osseous lesions. IMPRESSION: 1. Acute proximal left femoral shaft fracture, better evaluated on prior radiograph. 2. No other acute traumatic injury within the chest, abdomen, and pelvis. Electronically Signed   By: Rise Mu M.D.   On: 05/18/2017 06:17   Ct Cervical Spine Wo Contrast  Result Date: 05/18/2017 CLINICAL DATA:  Initial evaluation for acute trauma. EXAM: CT HEAD WITHOUT CONTRAST CT CERVICAL SPINE WITHOUT CONTRAST TECHNIQUE: Multidetector CT imaging of the head and cervical spine was performed following the standard protocol without intravenous contrast. Multiplanar CT image reconstructions of the cervical spine were also generated. COMPARISON:  None. FINDINGS: CT HEAD FINDINGS Brain: Study degraded by motion. Cerebral volume normal. No acute intracranial hemorrhage or infarct. No mass lesion, midline shift or mass effect. No hydrocephalus. No extra-axial fluid collection. Vascular: No hyperdense vessel. Skull: Soft tissue contusions with laceration present at the central forehead  near the vertex an at the left supraorbital region. Scalp soft tissues otherwise within normal limits. Calvarium intact. Sinuses/Orbits: Globes and orbital soft tissues normal. Minimal mucosal thickening within the left sphenoid sinus. Paranasal sinuses are otherwise clear. No mastoid effusion. CT CERVICAL SPINE FINDINGS Alignment: Vertebral bodies normally aligned with preservation of the normal cervical lordosis. No listhesis. Skull base and vertebrae: Skullbase intact. Normal C1-2 articulations are preserved in the dens is intact. Vertebral body heights maintained. No acute fracture. Soft tissues and spinal canal: Soft tissues of the neck demonstrate no acute abnormality. No prevertebral edema. Disc levels: No significant degenerative changes within the cervical spine. Upper chest: Visualized upper chest within normal limits. Visualized lung apices are clear. No apical pneumothorax. IMPRESSION: CT BRAIN: 1. No acute intracranial process. 2. Multifocal scalp contusions with lacerations about the forehead. Calvarium intact. CT CERVICAL SPINE: No acute traumatic injury within the cervical spine. Electronically Signed   By: Rise Mu M.D.   On: 05/18/2017 05:59   Ct Abdomen Pelvis W Contrast  Result Date: 05/18/2017 CLINICAL DATA:  Initial evaluation for acute trauma, fall from 3 story building. EXAM: CT CHEST, ABDOMEN, AND PELVIS WITH CONTRAST TECHNIQUE: Multidetector CT imaging of the chest, abdomen and pelvis was performed following the standard protocol during bolus administration of intravenous contrast. CONTRAST:  <See Chart> ISOVUE-300 IOPAMIDOL (ISOVUE-300) INJECTION 61% COMPARISON:  None. FINDINGS: CT CHEST FINDINGS Cardiovascular: Intrathoracic aorta of normal caliber and appearance without acute traumatic injury. Partially visualized great vessels within normal limits. Heart size normal. No pericardial effusion. Limited evaluation of the pulmonary arterial tree unremarkable.  Mediastinum/Nodes: Thyroid normal. No pathologically enlarged mediastinal, hilar, or axillary adenopathy. Esophagus within normal limits. No mediastinal hematoma or  pneumomediastinum. Lungs/Pleura: Tracheobronchial tree intact and patent. Lungs are clear bilaterally. No focal infiltrates to suggest pneumonia or contusion. No pulmonary edema or pleural effusion. No pneumothorax. No worrisome pulmonary nodule or mass. Musculoskeletal: External soft tissues within normal limits. No acute osseus abnormality. No fracture. No worrisome lytic or blastic osseous lesions. CT ABDOMEN PELVIS FINDINGS Hepatobiliary: Liver demonstrates a normal contrast enhanced appearance. Gallbladder within normal limits. No biliary dilatation. Pancreas: Pancreas within normal limits. Spleen: Spleen within normal limits. Adrenals/Urinary Tract: Adrenal glands are normal. Kidneys equal size with symmetric enhancement. No nephrolithiasis, hydronephrosis, or focal enhancing renal mass. No hydroureter. Bladder within normal limits. Stomach/Bowel: Stomach within normal limits. No evidence for bowel obstruction or acute bowel injury. Appendix normal. No acute inflammatory changes seen about the bowels. Vascular/Lymphatic: Normal intravascular enhancement seen throughout the intra-abdominal aorta and its branch vessels. No adenopathy. Reproductive: Prostate normal. Other: No free air or fluid. No mesenteric or retroperitoneal hematoma. Musculoskeletal: External soft tissues demonstrate no acute abnormality. Acute proximal left femoral shaft fracture. Prior not able plate screw fixation noted at the left femur, incompletely visualized. No other acute osseus abnormality. No worrisome lytic or blastic osseous lesions. IMPRESSION: 1. Acute proximal left femoral shaft fracture, better evaluated on prior radiograph. 2. No other acute traumatic injury within the chest, abdomen, and pelvis. Electronically Signed   By: Rise Mu M.D.   On:  05/18/2017 06:17   Dg Pelvis Portable  Result Date: 05/18/2017 CLINICAL DATA:  Status post fall from third story of building. Concern for pelvic injury. Initial encounter. EXAM: PORTABLE PELVIS 1-2 VIEWS COMPARISON:  None. FINDINGS: There is a displaced fracture of the proximal left femoral diaphysis, at the proximal edge of the patient's left femoral plate. Associated shortening and medial angulation noted. The femoral heads remain seated at their respective acetabula. The sacroiliac joints are grossly unremarkable in appearance. The visualized bowel gas pattern is grossly unremarkable. IMPRESSION: Displaced fracture of the proximal left femoral diaphysis, at the proximal edge of the patient's left femoral plate. Associated shortening and medial angulation. Electronically Signed   By: Roanna Raider M.D.   On: 05/18/2017 05:38   Dg Chest Port 1 View  Result Date: 05/18/2017 CLINICAL DATA:  Status post fall from third story. Initial encounter. EXAM: PORTABLE CHEST 1 VIEW COMPARISON:  None. FINDINGS: The lungs are well-aerated and clear. There is no evidence of focal opacification, pleural effusion or pneumothorax. The cardiomediastinal silhouette is within normal limits. No acute osseous abnormalities are seen. IMPRESSION: No displaced rib fractures seen. No acute cardiopulmonary process identified. Electronically Signed   By: Roanna Raider M.D.   On: 05/18/2017 05:36   Dg Femur Min 2 Views Left  Result Date: 05/18/2017 CLINICAL DATA:  Status post fall from third story of building, with left femur pain. Initial encounter. EXAM: LEFT FEMUR 2 VIEWS COMPARISON:  None. FINDINGS: There is a displaced oblique fracture extending across the proximal diaphysis of the left femur, along the proximal edge of the patient's left femoral plate. There is associated shortening, medial angulation and rotation. No additional fractures are seen. The distal left femur appears intact. No knee joint effusion is identified. The  left femoral head remains seated at the acetabulum. IMPRESSION: Displaced oblique fracture extending across the proximal diaphysis of the left femur, along the proximal edge of the patient's left femoral plate. Associated shortening, medial angulation and rotation. Electronically Signed   By: Roanna Raider M.D.   On: 05/18/2017 06:17    Procedures Procedures  CRITICAL CARE  Performed by: Joya GaskinsWICKLINE,Kohlton Gilpatrick W Total critical care time: 35 minutes Critical care time was exclusive of separately billable procedures and treating other patients. Critical care was necessary to treat or prevent imminent or life-threatening deterioration. Critical care was time spent personally by me on the following activities: development of treatment plan with patient and/or surrogate as well as nursing, discussions with consultants, evaluation of patient's response to treatment, examination of patient, obtaining history from patient or surrogate, ordering and performing treatments and interventions, ordering and review of laboratory studies, ordering and review of radiographic studies, pulse oximetry and re-evaluation of patient's condition.   Medications Ordered in ED Medications  iopamidol (ISOVUE-300) 61 % injection (  Contrast Given 05/18/17 0515)  Tdap (BOOSTRIX) injection 0.5 mL (0.5 mLs Intramuscular Given 05/18/17 0608)  ceFAZolin (ANCEF) IVPB 1 g/50 mL premix (0 g Intravenous Stopped 05/18/17 0656)  fentaNYL (SUBLIMAZE) injection 25 mcg (25 mcg Intravenous Given 05/18/17 0606)  fentaNYL (SUBLIMAZE) injection 50 mcg (50 mcg Intravenous Given 05/18/17 0647)     Initial Impression / Assessment and Plan / ED Course  I have reviewed the triage vital signs and the nursing notes.  Pertinent labs & imaging results that were available during my care of the patient were reviewed by me and considered in my medical decision making (see chart for details).     5:29 AM Pt seen on arrival for level 1 trauma s/p fall He is  awake/alert BP appropriate Pelvis stable Seen by dr Dw[place] CT imaging ordered He has obvious injuries to left femur and right wrist Will follow closely He has abrasion to left wrist but no bone protruding, will cover with TDAP and ancef 6:53 AM Pt awake/alert Vitals improved Pt with isolated right distal radius/left femur fracture No head/cspine/chest/abdominal trauma He was updated on plan  Seen at bedside by dr Charlann Boxerolin concerning his femur fx I have spoken to dr Melvyn Novasortmann concerning his right wrist fx If patient is comfortable and neurovascularly intact, does not recommend reduction due to need for surgery  Pt awake/alert and appears comfortable Sensory intact on right hand Distal pulses intact Will leave in temporary splint since surgery will be later this morning    Final Clinical Impressions(s) / ED Diagnoses   Final diagnoses:  Closed Colles' fracture of right radius, initial encounter  Closed displaced comminuted fracture of shaft of left femur, initial encounter (HCC)  Concussion with loss of consciousness, initial encounter  Cervical strain, acute, initial encounter  Blunt trauma to chest, initial encounter  Blunt trauma to abdomen, initial encounter    New Prescriptions New Prescriptions   No medications on file     Zadie RhineWickline, Kriti Katayama, MD 05/18/17 [phone]

## 2017-05-18 NOTE — Anesthesia Procedure Notes (Signed)
Procedure Name: Intubation Date/Time: 05/18/2017 9:53 AM Performed by: Oletta Lamas Pre-anesthesia Checklist: Patient identified, Emergency Drugs available, Suction available and Patient being monitored Patient Re-evaluated:Patient Re-evaluated prior to induction Oxygen Delivery Method: Circle System Utilized Preoxygenation: Pre-oxygenation with 100% oxygen Induction Type: IV induction Ventilation: Mask ventilation without difficulty Laryngoscope Size: Mac and 4 Grade View: Grade I Tube type: Oral Number of attempts: 1 Airway Equipment and Method: Stylet Placement Confirmation: ETT inserted through vocal cords under direct vision,  positive ETCO2 and breath sounds checked- equal and bilateral Secured at: 23 cm Tube secured with: Tape Dental Injury: Teeth and Oropharynx as per pre-operative assessment

## 2017-05-18 NOTE — Progress Notes (Signed)
No personal belonging with pt.

## 2017-05-18 NOTE — Brief Op Note (Signed)
05/18/2017  4:49 PM  PATIENT:  Gerald Munoz  21 y.o. male  PRE-OPERATIVE DIAGNOSIS:   1. LEFT SUBTROCHANTERIC FEMUR FRACTURE 2. REMOTE FEMORAL PLATE WITH OVERGROWN BONE 3. RIGHT DISTAL RADIUS FRACTURE  POST-OPERATIVE DIAGNOSIS:   1. LEFT SUBTROCHANTERIC FEMUR FRACTURE 2. REMOTE FEMORAL PLATE WITH OVERGROWN BONE 3. RIGHT DISTAL RADIUS FRACTURE   PROCEDURE:  Procedure(s): 1. OPEN REDUCTION INTERNAL FIXATION (ORIF) WRIST FRACTURE (Right) 2. INTRAMEDULLARY (IM) NAIL LEFT SUBTROCHANTRIC (Left) WITH VERSANAIL 9 X 400 MM STATICALLY LOCKED 3. FEMORAL OSTEOTOMY 4. HARDWARE REMOVAL  SURGEON:  Surgeon(s) and Role: Panel 1: PLEASE SEE SEPARATE DICTATION    * Bradly Bienenstockrtmann, Fred, MD - Primary  Panel 2:    * Myrene GalasHandy, Jerni Selmer, MD - Primary    * Montez MoritaKEITH PAUL, PA-C - Assisting  PHYSICIAN ASSISTANT: Montez MoritaKEITH PAUL, PA-C  ANESTHESIA:   general  EBL:  Total I/O In: 2000 [I.V.:2000] Out: 1925 [Urine:1575; Blood:350]  BLOOD ADMINISTERED:none  DRAINS: none   LOCAL MEDICATIONS USED:  NONE  SPECIMEN:  No Specimen  DISPOSITION OF SPECIMEN:  N/A  COUNTS:  YES  TOURNIQUET:   Total Tourniquet Time Documented: Upper Arm (Right) - 48 minutes Total: Upper Arm (Right) - 48 minutes   DICTATION: .Other Dictation: Dictation Number [id number]  PLAN OF CARE: Admit to inpatient   PATIENT DISPOSITION:  PACU - hemodynamically stable.   Delay start of Pharmacological VTE agent (>24hrs) due to surgical blood loss or risk of bleeding: no

## 2017-05-18 NOTE — Progress Notes (Signed)
Dr. Luisa Hartornett has ordered suicide precations.

## 2017-05-18 NOTE — Anesthesia Preprocedure Evaluation (Addendum)
Anesthesia Evaluation  Patient identified by MRN, date of birth, ID band Patient awake  General Assessment Comment:History noted. CG  Reviewed: Allergy & Precautions, NPO status , Patient's Chart, lab work & pertinent test results  Airway Mallampati: II  TM Distance: >3 FB     Dental   Pulmonary Current Smoker,    breath sounds clear to auscultation       Cardiovascular negative cardio ROS  (-) pacemaker Rhythm:Regular Rate:Normal     Neuro/Psych    GI/Hepatic negative GI ROS, Neg liver ROS,   Endo/Other  negative endocrine ROS  Renal/GU negative Renal ROS     Musculoskeletal   Abdominal   Peds  Hematology   Anesthesia Other Findings   Reproductive/Obstetrics                             Anesthesia Physical Anesthesia Plan  ASA: I and emergent  Anesthesia Plan: General   Post-op Pain Management:    Induction: Intravenous, Rapid sequence and Cricoid pressure planned  PONV Risk Score and Plan: 2 and Ondansetron, Dexamethasone, Propofol infusion and Treatment may vary due to age or medical condition  Airway Management Planned: Oral ETT  Additional Equipment:   Intra-op Plan:   Post-operative Plan: Possible Post-op intubation/ventilation  Informed Consent: I have reviewed the patients History and Physical, chart, labs and discussed the procedure including the risks, benefits and alternatives for the proposed anesthesia with the patient or authorized representative who has indicated his/her understanding and acceptance.   Dental advisory given  Plan Discussed with: CRNA and Anesthesiologist  Anesthesia Plan Comments:        Anesthesia Quick Evaluation

## 2017-05-18 NOTE — Progress Notes (Signed)
   05/18/17 0500  Clinical Encounter Type  Visited With Health care provider  Visit Type ED  Spiritual Encounters  Spiritual Needs Emotional  Stress Factors  Patient Stress Factors Health changes  Pt in pain. Law enforcement involved and unable to retrieve family information at this time. Available as needed.

## 2017-05-18 NOTE — Transfer of Care (Signed)
Immediate Anesthesia Transfer of Care Note  Patient: Adelina MingsChristopher Albert XXXAlston  Procedure(s) Performed: Procedure(s): OPEN REDUCTION INTERNAL FIXATION (ORIF) WRIST FRACTURE (Right) INTRAMEDULLARY (IM) NAIL INTERTROCHANTRIC (Left) HARDWARE REMOVAL  Patient Location: PACU  Anesthesia Type:General  Level of Consciousness: awake, alert  and oriented  Airway & Oxygen Therapy: Patient Spontanous Breathing and Patient connected to nasal cannula oxygen  Post-op Assessment: Report given to RN and Post -op Vital signs reviewed and stable  Post vital signs: Reviewed and stable  Last Vitals:  Vitals:   05/18/17 0800 05/18/17 0831  BP: 137/74 138/63  Pulse: 85 88  Resp:    Temp:  37.4 C    Last Pain:  Vitals:   05/18/17 0831  TempSrc: Oral  PainSc:          Complications: No apparent anesthesia complications

## 2017-05-19 ENCOUNTER — Encounter (HOSPITAL_COMMUNITY): Payer: Self-pay | Admitting: General Practice

## 2017-05-19 ENCOUNTER — Inpatient Hospital Stay (HOSPITAL_COMMUNITY): Payer: 59

## 2017-05-19 LAB — COMPREHENSIVE METABOLIC PANEL
ALK PHOS: 35 U/L — AB (ref 38–126)
ALT: 35 U/L (ref 17–63)
AST: 118 U/L — ABNORMAL HIGH (ref 15–41)
Albumin: 3.4 g/dL — ABNORMAL LOW (ref 3.5–5.0)
Anion gap: 6 (ref 5–15)
BUN: 8 mg/dL (ref 6–20)
CALCIUM: 8.5 mg/dL — AB (ref 8.9–10.3)
CO2: 26 mmol/L (ref 22–32)
CREATININE: 0.93 mg/dL (ref 0.61–1.24)
Chloride: 105 mmol/L (ref 101–111)
GFR calc non Af Amer: >60 mL/min (ref >60)
Glucose, Bld: 115 mg/dL — ABNORMAL HIGH (ref 65–99)
Potassium: 3.9 mmol/L (ref 3.5–5.1)
SODIUM: 137 mmol/L (ref 135–145)
Total Bilirubin: 1.3 mg/dL — ABNORMAL HIGH (ref 0.3–1.2)
Total Protein: 5.5 g/dL — ABNORMAL LOW (ref 6.5–8.1)

## 2017-05-19 LAB — CBC
HCT: 32.9 % — ABNORMAL LOW (ref 39.0–52.0)
HEMOGLOBIN: 11.6 g/dL — AB (ref 13.0–17.0)
MCH: 30 pg (ref 26.0–34.0)
MCHC: 35.3 g/dL (ref 30.0–36.0)
MCV: 85 fL (ref 78.0–100.0)
PLATELETS: 220 10*3/uL (ref 150–400)
RBC: 3.87 MIL/uL — ABNORMAL LOW (ref 4.22–5.81)
RDW: 12.2 % (ref 11.5–15.5)
WBC: 10.5 10*3/uL (ref 4.0–10.5)

## 2017-05-19 LAB — HIV ANTIBODY (ROUTINE TESTING W REFLEX): HIV SCREEN 4TH GENERATION: NONREACTIVE

## 2017-05-19 MED ORDER — TRAMADOL HCL 50 MG PO TABS
50.0000 mg | ORAL_TABLET | Freq: Four times a day (QID) | ORAL | Status: DC
  Administered 2017-05-19 – 2017-05-21 (×7): 50 mg via ORAL
  Filled 2017-05-19 (×8): qty 1

## 2017-05-19 MED ORDER — OXYCODONE HCL 5 MG PO TABS
5.0000 mg | ORAL_TABLET | ORAL | Status: DC | PRN
  Administered 2017-05-19 – 2017-05-20 (×3): 15 mg via ORAL
  Filled 2017-05-19 (×3): qty 3

## 2017-05-19 MED ORDER — CEFAZOLIN SODIUM-DEXTROSE 1-4 GM/50ML-% IV SOLN
1.0000 g | Freq: Three times a day (TID) | INTRAVENOUS | Status: AC
  Administered 2017-05-19 – 2017-05-21 (×6): 1 g via INTRAVENOUS
  Filled 2017-05-19 (×6): qty 50

## 2017-05-19 NOTE — Care Management Note (Signed)
Case Management Note  Patient Details  Name: Adelina MingsChristopher Albert XXXAlston MRN: [id number] Date of Birth: 12-10-1995  Subjective/Objective:    Pt admitted on 05/18/17 with Rt radius and Lt femur fractures, after jumping from a 3rd story balcony.  PTA, pt independent, lives with mother and sister, at bedside. 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                      Action/Plan: PT/OT consults pending.  Will continue to follow for discharge planning as pt progresses.   Mother and sister able to provide care at discharge.    Expected Discharge Date:                  Expected Discharge Plan:     In-House Referral:  Clinical Social Work  Discharge planning Services  CM Consult  Post Acute Care Choice:    Choice offered to:     DME Arranged:    DME Agency:     HH Arranged:    HH Agency:     Status of Service:  In process, will continue to follow  If discussed at Long Length of Stay Meetings, dates discussed:    Additional Comments:  Quintella BatonJulie W. Yvonne Petite, RN, BSN  Trauma/Neuro ICU Case Manager [phone]

## 2017-05-19 NOTE — Progress Notes (Signed)
Orthopedic Tech Progress Note Patient Details:  Adelina MingsChristopher Albert XXXAlston 08/18/1996 [id number]  Ortho Devices Type of Ortho Device: Ace wrap, Sugartong splint, Arm sling Ortho Device/Splint Location: applied ohf to bed Ortho Device/Splint Interventions: Application   Saul FordyceJennifer C Mairyn Lenahan 05/19/2017, 1:14 PM

## 2017-05-19 NOTE — Progress Notes (Signed)
Dr. Janee Mornhompson approved IV placement in patients foot. Thanks.

## 2017-05-19 NOTE — Evaluation (Signed)
Occupational Therapy Evaluation Patient Details Name: Gerald Munoz MRN: [id number] DOB: [birth year] Today's Date: 05/19/2017    History of Present Illness Pt is a 21 yo male who fell from 3rd floor balcony. Pt sustained bilat wrist fractures (bilat NWB at wrist/hand) and L femur fracture, s/p ORIF, L LE TTWB.    Clinical Impression   PTA, pt was independent (ADLs, IADLs, and working at [employer])  and living with his parents. Pt currently requires Mod A for UB ADLs and Max A for LB ADLs. Pt will requires 24 hour assist at dc due to bil NWB and TDWB LLE. Pt would benefit from acute OT to facilitate safe dc. Recommend dc with HHOT to optimize safety and functional performance.     Follow Up Recommendations  Home health OT;Supervision/Assistance - 24 hour (Pending pt progress)    Equipment Recommendations  3 in 1 bedside commode    Recommendations for Other Services PT consult     Precautions / Restrictions Precautions Precautions: Fall Restrictions Weight Bearing Restrictions: Yes RUE Weight Bearing: Non weight bearing (Verbal order to WB through elbow) LUE Weight Bearing: Non weight bearing LLE Weight Bearing: Touchdown weight bearing      Mobility Bed Mobility Overal bed mobility: Needs Assistance Bed Mobility: Supine to Sit     Supine to sit: Max assist;Total assist     General bed mobility comments: maxA for L LLE and trunk, unable to use bilat UEs due to bilat wrist/hand NWB  Transfers Overall transfer level: Needs assistance Equipment used: Right platform walker (found out about L wrist fracture s/p eval, needs bilat plat) Transfers: Sit to/from [employer] Sit to Stand: Max assist;+2 physical assistance Stand pivot transfers: Max assist;+2 physical assistance       General transfer comment: pt able to push down through R elbow, OT assist with L UE and allowed pt to push down thruogh elbow due to L wrist pain. Pt able to pivot on R foot  maintain L LE TTWB    Balance Overall balance assessment: Needs assistance Sitting-balance support: Feet supported;No upper extremity supported Sitting balance-Leahy Scale: Fair Sitting balance - Comments: limtied by pain   Standing balance support: Bilateral upper extremity supported Standing balance-Leahy Scale: Zero Standing balance comment: dependent on external support                           ADL either performed or assessed with clinical judgement   ADL Overall ADL's : Needs assistance/impaired Eating/Feeding: Sitting;Moderate assistance   Grooming: Sitting;Moderate assistance   Upper Body Bathing: Sitting;Moderate assistance   Lower Body Bathing: Sit to/from stand;+2 for physical assistance;Maximal assistance   Upper Body Dressing : Sitting;Moderate assistance   Lower Body Dressing: Maximal assistance;Sit to/from stand;+2 for physical assistance   Toilet Transfer: Maximal assistance;+2 for physical assistance;Stand-pivot (Simulated to recliner)             General ADL Comments: Pt demosntrating decreased functional performance due to significant pain and WB status. Pt motivated to particapte in therapy but requires Max A for LB and Min UB. Pt going to placed in L splint as well and UB functional status will change.      Vision         Perception     Praxis      Pertinent Vitals/Pain Pain Assessment: 0-10 Pain Score: 10-Worst pain ever Pain Location: L leg 6/10; r arm 10/10 Pain Descriptors / Indicators: Constant;Discomfort;Grimacing;Guarding;Throbbing;Moaning Pain Intervention(s): Monitored during  session;Limited activity within patient's tolerance;Repositioned;Ice applied     Hand Dominance Right   Extremity/Trunk Assessment Upper Extremity Assessment Upper Extremity Assessment: RUE deficits/detail;LUE deficits/detail RUE Deficits / Details: ORIF of R wrist fx. shoulder WFL AAROM RUE: Unable to fully assess due to immobilization;Unable  to fully assess due to pain RUE Coordination: decreased fine motor;decreased gross motor LUE Deficits / Details: L intra-articular distal radius fracture with no displacement. WFL elbow and shoulder LUE: Unable to fully assess due to pain LUE Coordination: decreased fine motor;decreased gross motor   Lower Extremity Assessment Lower Extremity Assessment: Defer to PT evaluation LLE Deficits / Details: trace quad set initiation due to pain   Cervical / Trunk Assessment Cervical / Trunk Assessment: Normal   Communication Communication Communication: No difficulties   Cognition Arousal/Alertness: Awake/alert Behavior During Therapy: WFL for tasks assessed/performed Overall Cognitive Status: Within Functional Limits for tasks assessed                                     General Comments  Educated pt on edema management and UE positioning    Exercises Exercises: General Upper Extremity General Exercises - Upper Extremity Shoulder Flexion: AAROM;Right;10 reps;Supine Shoulder Extension: AAROM;Right;10 reps;Supine   Shoulder Instructions      Home Living Family/patient expects to be discharged to:: Private residence Living Arrangements: Parent Available Help at Discharge: Family;Available 24 hours/day Type of Home: House Home Access: Stairs to enter Entergy CorporationEntrance Stairs-Number of Steps: 3 front door; 2 garage Entrance Stairs-Rails: None (none at garage) Home Layout: Two level;Able to live on main level with bedroom/bathroom Alternate Level Stairs-Number of Steps: 12   Bathroom Shower/Tub: Tub/shower unit;Curtain   FirefighterBathroom Toilet: Standard     Home Equipment: None          Prior Functioning/Environment Level of Independence: Independent        Comments: worked for [employer]        OT Problem List: Decreased strength;Decreased range of motion;Decreased activity tolerance;Impaired balance (sitting and/or standing);Decreased safety awareness;Decreased knowledge  of use of DME or AE;Decreased knowledge of precautions;Pain;Impaired UE functional use      OT Treatment/Interventions: Self-care/ADL training;Therapeutic exercise;Energy conservation;DME and/or AE instruction;Therapeutic activities;Patient/family education    OT Goals(Current goals can be found in the care plan section) Acute Rehab OT Goals Patient Stated Goal: stop the pain OT Goal Formulation: With patient Time For Goal Achievement: 06/02/17 Potential to Achieve Goals: Good ADL Goals Pt Will Perform Eating: with min assist;with caregiver independent in assisting;with adaptive utensils;sitting Pt Will Perform Upper Body Dressing: with min assist;with caregiver independent in assisting;sitting Pt Will Perform Lower Body Dressing: with mod assist;with caregiver independent in assisting;sit to/from stand Pt Will Transfer to Toilet: with mod assist;stand pivot transfer;bedside commode;ambulating  OT Frequency: Min 2X/week   Barriers to D/C:            Co-evaluation PT/OT/SLP Co-Evaluation/Treatment: Yes Reason for Co-Treatment: Complexity of the patient's impairments (multi-system involvement) PT goals addressed during session: Mobility/safety with mobility OT goals addressed during session: ADL's and self-care      AM-PAC PT "6 Clicks" Daily Activity     Outcome Measure Help from another person eating meals?: A Little Help from another person taking care of personal grooming?: A Little Help from another person toileting, which includes using toliet, bedpan, or urinal?: A Lot Help from another person bathing (including washing, rinsing, drying)?: A Lot Help from another person to put on  and taking off regular upper body clothing?: A Little Help from another person to put on and taking off regular lower body clothing?: A Lot 6 Click Score: 15   End of Session Equipment Utilized During Treatment: Gait belt (Plateform walker) Nurse Communication: Mobility status;Weight bearing  status;Precautions  Activity Tolerance: Patient limited by pain Patient left: in chair;with call bell/phone within reach  OT Visit Diagnosis: Unsteadiness on feet (R26.81);Other abnormalities of gait and mobility (R26.89);Muscle weakness (generalized) (M62.81);Pain Pain - Right/Left:  (Bilateral) Pain - part of body: Leg;Hand;Arm (Bil wrists; L leg)                Time: 1426-1510 OT Time Calculation (min): 44 min Charges:  OT General Charges $OT Visit: 1 Procedure OT Evaluation $OT Eval Moderate Complexity: 1 Procedure OT Treatments $Self Care/Home Management : 8-22 mins G-Codes:     Dohn Stclair MSOT, OTR/L Acute Rehab Pager: [phone] Office: [phone]  Theodoro Grist Calisa Luckenbaugh 05/19/2017, 5:43 PM

## 2017-05-19 NOTE — Progress Notes (Signed)
Talked to Trauma Dr. Janee Mornhompson. Pt's suicide sitter has been discontinued.  No Psy consult has been ordered. Notified AC.

## 2017-05-19 NOTE — Progress Notes (Signed)
Orthopedic Tech Progress Note Patient Details:  Gerald Munoz [birth year] [id number]  Ortho Devices Type of Ortho Device: Volar splint, Arm sling Ortho Device/Splint Location: lue Ortho Device/Splint Interventions: Ordered, Application, Adjustment   Trinna PostMartinez, Donesha Wallander J 05/19/2017, 8:23 PM

## 2017-05-19 NOTE — Progress Notes (Signed)
1 Day Post-Op  Subjective: C/O pain R wrist and L leg. He states he was depressed yesterday with thoughts of wanting to hurt himself but these feelings only lasted "30 seconds". He denies suicidal ideation at this time. He denies any inclination to self-harm.  Objective: Vital signs in last 24 hours: Temp:  [97.3 F (36.3 C)-99.6 F (37.6 C)] 98.2 F (36.8 C) (08/06 0605) Pulse Rate:  [62-80] 69 (08/06 0605) Resp:  [10-23] 16 (08/06 0605) BP: (126-156)/(61-87) 126/75 (08/06 0605) SpO2:  [97 %-100 %] 97 % (08/06 0605)    Intake/Output from previous day: 08/05 0701 - 08/06 0700 In: 7443.3 [I.V.:7343.3; IV Piggyback:100] Out: 3050 [Urine:2700; Blood:350] Intake/Output this shift: No intake/output data recorded.  Gen: alert, NAD Lungs; CTA CV; RRR Abd; soft, NT, ND Ext: splint R wrist and dressing L thigh, moves digits and they are warm Neuro: A7O  Lab Results: CBC   Recent Labs  05/18/17 0530 05/18/17 0538 05/19/17 0404  WBC 8.7  --  10.5  HGB 13.1 13.6 11.6*  HCT 37.3* 40.0 32.9*  PLT 224  --  220   BMET  Recent Labs  05/18/17 0530 05/18/17 0538 05/19/17 0404  NA 139 144 137  K 3.1* 3.3* 3.9  CL 109 110 105  CO2 21*  --  26  GLUCOSE 100* 94 115*  BUN 11 12 8   CREATININE 0.93 0.90 0.93  CALCIUM 8.6*  --  8.5*   PT/INR  Recent Labs  05/18/17 0530  LABPROT 13.3  INR 1.01   Anti-infectives: Anti-infectives    Start     Dose/Rate Route Frequency Ordered Stop   05/18/17 2000  ceFAZolin (ANCEF) IVPB 1 g/50 mL premix     1 g 100 mL/hr over 30 Minutes Intravenous Every 6 hours 05/18/17 1822 05/19/17 1359   05/18/17 0912  ceFAZolin (ANCEF) 2-4 GM/100ML-% IVPB    Comments:  Gerald Munoz, Gerald Munoz   : cabinet override      05/18/17 0912 05/18/17 1841   05/18/17 0530  ceFAZolin (ANCEF) IVPB 1 g/50 mL premix     1 g 100 mL/hr over 30 Minutes Intravenous  Once 05/18/17 0522 05/18/17 0656      Assessment/Plan: Jump from 3rd floor balcony Open R distal  radius FX - S/P ORIF and fasciotomies by Dr. Marilynn Latinortman L subtroch femur FX - S/P ORIF and removal old hardware by Dr. Carola FrostHandy, TDWB SI - I spoke with him at length about this as above. No SI or desire to self-harm at this time. He agrees to notify staff if he feels this was again. L wrist pain - check X-rays FEN - decrease IVF, increase oxy scale, add scheduled Ultram VTE - Lovenox ID - Ancef total 72h for open FX Dispo - PT/OT  LOS: 1 day    Violeta GelinasBurke Andre Gallego, MD, MPH, FACS Trauma: [phone] General Surgery: [phone]  8/6/2018Patient ID: Gerald Munoz, male   DOB: Dec 01, 1995, 21 y.o.   MRN: [id number]

## 2017-05-19 NOTE — Progress Notes (Signed)
Orthopedic Trauma Service Progress Note   Patient ID: Gerald Munoz MRN: [id number] DOB/AGE: 23-01-1996 23 y.o.  Subjective:  Doing fair C/o pain in R wrist > L leg Denies numbness or tingling in L leg  Has not worked with therapies yet  Events of yesterday evening noted    Review of Systems  Respiratory: Negative for shortness of breath and wheezing.   Cardiovascular: Negative for chest pain and palpitations.  Gastrointestinal: Negative for abdominal pain and vomiting.    Objective:   VITALS:   Vitals:   05/18/17 2030 05/18/17 2128 05/19/17 0127 05/19/17 0605  BP: (!) 141/75 (!) 148/73 135/75 126/75  Pulse: 66 70 68 69  Resp: 16 16 15 16   Temp:  98.4 F (36.9 C) 99.5 F (37.5 C) 98.2 F (36.8 C)  TempSrc:  Axillary Oral Oral  SpO2: 100% 100% 97% 97%  Weight:      Height:        Intake/Output      08/05 0701 - 08/06 0700 08/06 0701 - 08/07 0700   P.O.  120   I.V. (mL/kg) 7343.3 (135)    IV Piggyback 100    Total Intake(mL/kg) 7443.3 (136.8) 120 (2.2)   Urine (mL/kg/hr) 2700 (2.1)    Blood 350    Total Output 3050     Net +4393.3 +120          LABS  Results for orders placed or performed during the hospital encounter of 05/18/17 (from the past 24 hour(s))  Provider-confirm verbal Blood Bank order - FFP, Type & Screen, RBC; 4 Units; Order taken: 05/18/2017; 4:55 AM; Level 1 Trauma, Emergency Release Blood bank ordered to emergency release 2 FFP units and 2 RBC units     Status: None   Collection Time: 05/18/17 12:00 PM  Result Value Ref Range   Blood product order confirm MD AUTHORIZATION REQUESTED   CBC     Status: Abnormal   Collection Time: 05/19/17  4:04 AM  Result Value Ref Range   WBC 10.5 4.0 - 10.5 K/uL   RBC 3.87 (L) 4.22 - 5.81 MIL/uL   Hemoglobin 11.6 (L) 13.0 - 17.0 g/dL   HCT 78.2 (L) 95.6 - 21.3 %   MCV 85.0 78.0 - 100.0 fL   MCH 30.0 26.0 - 34.0 pg   MCHC 35.3 30.0 - 36.0 g/dL   RDW 08.6 57.8 -  46.9 %   Platelets 220 150 - 400 K/uL  Comprehensive metabolic panel     Status: Abnormal   Collection Time: 05/19/17  4:04 AM  Result Value Ref Range   Sodium 137 135 - 145 mmol/L   Potassium 3.9 3.5 - 5.1 mmol/L   Chloride 105 101 - 111 mmol/L   CO2 26 22 - 32 mmol/L   Glucose, Bld 115 (H) 65 - 99 mg/dL   BUN 8 6 - 20 mg/dL   Creatinine, Ser 6.29 0.61 - 1.24 mg/dL   Calcium 8.5 (L) 8.9 - 10.3 mg/dL   Total Protein 5.5 (L) 6.5 - 8.1 g/dL   Albumin 3.4 (L) 3.5 - 5.0 g/dL   AST 528 (H) 15 - 41 U/L   ALT 35 17 - 63 U/L   Alkaline Phosphatase 35 (L) 38 - 126 U/L   Total Bilirubin 1.3 (H) 0.3 - 1.2 mg/dL   GFR calc non Af Amer >60 >60 mL/min   GFR calc Af Amer >60 >60 mL/min   Anion gap 6 5 - 15     PHYSICAL  EXAM:   Gen: in bed NAD Ext:       Left Lower Extremity   Dressing stable   Minimal swelling  DPN, SPN, TN sensation intact  EHL, FHL, AT, PT, peroneals, gastroc motor intact  Ext warm   + DP pulse  No pain with passive stretch   Assessment/Plan: 1 Day Post-Op   Active Problems:   Femur fracture, left (HCC)   Anti-infectives    Start     Dose/Rate Route Frequency Ordered Stop   05/19/17 1700  ceFAZolin (ANCEF) IVPB 1 g/50 mL premix     1 g 100 mL/hr over 30 Minutes Intravenous Every 8 hours 05/19/17 0906 05/21/17 1359   05/18/17 2000  ceFAZolin (ANCEF) IVPB 1 g/50 mL premix     1 g 100 mL/hr over 30 Minutes Intravenous Every 6 hours 05/18/17 1822 05/19/17 1359   05/18/17 0912  ceFAZolin (ANCEF) 2-4 GM/100ML-% IVPB    Comments:  Kathrene BongoSmith, Catherine   : cabinet override      05/18/17 0912 05/18/17 1841   05/18/17 0530  ceFAZolin (ANCEF) IVPB 1 g/50 mL premix     1 g 100 mL/hr over 30 Minutes Intravenous  Once 05/18/17 0522 05/18/17 0656    .  POD/HD#: 1   21 y/o black male s/p jump from 3rd/4th story balcony with multiple ortho injuries    -Left subtrochanteric peri-implant femur fracture s/p ROH and IMN L femur              TDWB L leg  ROM as  tolerated  Dressing changes as needed  Ice PRN  PT/OT evals  TED hose    - R distal radius fracture              Per Dr. Melvyn Novasrtmann   - Pain management:             Per TS   - ABL anemia/Hemodynamics             Stable   CBC in am    - DVT/PE prophylaxis:             Lovenox/scds post op   Recommend lovenox x 21 days    - ID:              periop abx to be completed today    - Dispo:             therapy evals     Mearl LatinKeith W. Paul, PA-C [facility] [phone] ([phone] (O) 05/19/2017, 10:47 AM

## 2017-05-19 NOTE — Progress Notes (Signed)
OT Cancellation    05/19/17 0900  OT Visit Information  Last OT Received On 05/19/17  Reason Eval/Treat Not Completed Patient at procedure or test/ unavailable (Off floor for x-ray. Will return as schedule allows.)   Aeron Lheureux MSOT, OTR/L Acute Rehab Pager: [phone] Office: [phone]

## 2017-05-19 NOTE — Evaluation (Signed)
Physical Therapy Evaluation Patient Details Name: Gerald Munoz MRN: [id number] DOB: November 06, 1995 Today's Date: 05/19/2017   History of Present Illness  Pt is a 21 yo male who fell from 3rd floor balcony. Pt sustained bilat wrist fractures (bilat NWB at wrist/hand) and L femur fracture, s/p ORIF, L LE TTWB.   Clinical Impression  Pt mobility limited by pain this date. Pt will need 24/7 supervision for safe d/c home as pt with bilat UE NWB and L LE TWB. Pt will need w/c for long distance mobility however won't be able to use bilat UE to propel self. Acute PT to con't to follow to progress mobility and ambulation.    Follow Up Recommendations Home health PT;Supervision/Assistance - 24 hour    Equipment Recommendations  Wheelchair (measurements PT);Wheelchair cushion (measurements PT);3in1 (PT) (platform walker)    Recommendations for Other Services       Precautions / Restrictions Precautions Precautions: Fall Restrictions Weight Bearing Restrictions: Yes RUE Weight Bearing: Non weight bearing (Verbal order to WB through elbow) LUE Weight Bearing: Non weight bearing LLE Weight Bearing: Touchdown weight bearing      Mobility  Bed Mobility Overal bed mobility: Needs Assistance Bed Mobility: Supine to Sit     Supine to sit: Max assist;Total assist     General bed mobility comments: maxA for L LLE and trunk, unable to use bilat UEs due to bilat wrist/hand NWB  Transfers Overall transfer level: Needs assistance Equipment used: Right platform walker (found out about L wrist fracture s/p eval, needs bilat plat) Transfers: Sit to/from [employer] CorporationStand;Stand Pivot Transfers Sit to Stand: Max assist;+2 physical assistance Stand pivot transfers: Max assist;+2 physical assistance       General transfer comment: pt able to push down through R elbow, OT assist with L UE and allowed pt to push down thruogh elbow due to L wrist pain. Pt able to pivot on R foot maintain L LE  TTWB  Ambulation/Gait             General Gait Details: unable due to pain at this time  Stairs            Wheelchair Mobility    Modified Rankin (Stroke Patients Only)       Balance Overall balance assessment: Needs assistance Sitting-balance support: Feet supported;No upper extremity supported Sitting balance-Leahy Scale: Fair Sitting balance - Comments: limtied by pain   Standing balance support: Bilateral upper extremity supported Standing balance-Leahy Scale: Zero Standing balance comment: dependent on external support                             Pertinent Vitals/Pain Pain Assessment: 0-10 Pain Score: 10-Worst pain ever Pain Location: L leg 6/10; r arm 10/10 Pain Descriptors / Indicators: Constant;Discomfort;Grimacing;Guarding;Throbbing;Moaning Pain Intervention(s): Monitored during session    Home Living Family/patient expects to be discharged to:: Private residence Living Arrangements: Parent Available Help at Discharge: Family;Available 24 hours/day Type of Home: House Home Access: Stairs to enter Entrance Stairs-Rails: None (none at garage) Entrance Stairs-Number of Steps: 3 front door; 2 garage Home Layout: Two level;Able to live on main level with bedroom/bathroom Home Equipment: None      Prior Function Level of Independence: Independent         Comments: worked for [employer]     Hand Dominance        Extremity/Trunk Assessment   Upper Extremity Assessment Upper Extremity Assessment: Defer to OT evaluation    Lower  Extremity Assessment Lower Extremity Assessment: LLE deficits/detail LLE Deficits / Details: trace quad set initiation due to pain    Cervical / Trunk Assessment Cervical / Trunk Assessment: Normal  Communication   Communication: No difficulties  Cognition Arousal/Alertness: Awake/alert Behavior During Therapy: WFL for tasks assessed/performed Overall Cognitive Status: Within Functional Limits for tasks  assessed                                        General Comments General comments (skin integrity, edema, etc.): pt with R UE in sling/long arm cast    Exercises     Assessment/Plan    PT Assessment Patient needs continued PT services  PT Problem List Decreased strength;Decreased range of motion;Decreased activity tolerance;Decreased mobility;Decreased balance;Decreased coordination;Decreased knowledge of use of DME;Decreased safety awareness;Pain       PT Treatment Interventions DME instruction;Gait training;Stair training;Functional mobility training;Therapeutic activities;Therapeutic exercise;Balance training;Patient/family education    PT Goals (Current goals can be found in the Care Plan section)  Acute Rehab PT Goals Patient Stated Goal: stop the pain PT Goal Formulation: With patient Time For Goal Achievement: 05/26/17 Potential to Achieve Goals: Good    Frequency Min 5X/week   Barriers to discharge        Co-evaluation PT/OT/SLP Co-Evaluation/Treatment: Yes Reason for Co-Treatment: Complexity of the patient's impairments (multi-system involvement) PT goals addressed during session: Mobility/safety with mobility         AM-PAC PT "6 Clicks" Daily Activity  Outcome Measure Difficulty turning over in bed (including adjusting bedclothes, sheets and blankets)?: Total Difficulty moving from lying on back to sitting on the side of the bed? : Total Difficulty sitting down on and standing up from a chair with arms (e.g., wheelchair, bedside commode, etc,.)?: Total Help needed moving to and from a bed to chair (including a wheelchair)?: Total Help needed walking in hospital room?: Total Help needed climbing 3-5 steps with a railing? : Total 6 Click Score: 6    End of Session Equipment Utilized During Treatment: Gait belt Activity Tolerance: Patient limited by pain Patient left: in chair;with call bell/phone within reach Nurse Communication: Mobility  status;Patient requests pain meds PT Visit Diagnosis: Difficulty in walking, not elsewhere classified (R26.2);Pain Pain - Right/Left: Right Pain - part of body:  (wrist)    Time: 1610-9604 PT Time Calculation (min) (ACUTE ONLY): 28 min   Charges:   PT Evaluation $PT Eval High Complexity: 1 High     PT G Codes:        Lewis Shock, PT, DPT Pager #: [phone] Office #: [phone]   Abagayle Klutts M Adalynn Corne 05/19/2017, 3:53 PM

## 2017-05-19 NOTE — Progress Notes (Signed)
  The radiographs of the left wrist were reviewed and interpreted by myself and Dr Melvyn Novasrtmann. The fracture is an intra-articular distal radius fracture with no displacement. There is also a concurrent ulnar styloid fracture. These types of fractures can be treated with immobilization to stabilize the wrist joint and allow for proper healing of the bones. An order has been placed for the patient to be put into a short arm volar splint for the left wrist. The patient will need to keep the splint clean and dry. He will need to remain non-weightbearing with the left wrist and hand. He will need to follow up in our office in 2 weeks for a follow up evaluation.   Lambert ModySamantha Dakwan Pridgen, PA-C  05/19/17

## 2017-05-20 LAB — CBC
HEMATOCRIT: 33.9 % — AB (ref 39.0–52.0)
Hemoglobin: 12.1 g/dL — ABNORMAL LOW (ref 13.0–17.0)
MCH: 30.5 pg (ref 26.0–34.0)
MCHC: 35.7 g/dL (ref 30.0–36.0)
MCV: 85.4 fL (ref 78.0–100.0)
Platelets: 201 10*3/uL (ref 150–400)
RBC: 3.97 MIL/uL — ABNORMAL LOW (ref 4.22–5.81)
RDW: 12 % (ref 11.5–15.5)
WBC: 10.6 10*3/uL — ABNORMAL HIGH (ref 4.0–10.5)

## 2017-05-20 MED ORDER — SODIUM CHLORIDE 0.9% FLUSH
3.0000 mL | Freq: Two times a day (BID) | INTRAVENOUS | Status: DC
  Administered 2017-05-20 (×2): 3 mL via INTRAVENOUS

## 2017-05-20 MED ORDER — SODIUM CHLORIDE 0.9% FLUSH: 3.0000 mL | INTRAVENOUS | Status: DC | PRN

## 2017-05-20 MED ORDER — SODIUM CHLORIDE 0.9 % IV SOLN: 250.0000 mL | INTRAVENOUS | Status: DC | PRN

## 2017-05-20 MED ORDER — ACETAMINOPHEN 325 MG PO TABS
650.0000 mg | ORAL_TABLET | Freq: Three times a day (TID) | ORAL | Status: DC
  Administered 2017-05-20 – 2017-05-21 (×3): 650 mg via ORAL
  Filled 2017-05-20 (×3): qty 2

## 2017-05-20 MED ORDER — OXYCODONE HCL 5 MG PO TABS
5.0000 mg | ORAL_TABLET | ORAL | Status: DC | PRN
  Administered 2017-05-20: 5 mg via ORAL
  Filled 2017-05-20: qty 1

## 2017-05-20 NOTE — Progress Notes (Signed)
[facility] Progress Note  2 Days Post-Op  Subjective: CC: stiffness in LLE Patient complaining he's unable to move his LLE on his own at this time and feels stiff. Pain well controlled. Concerned that he may be released into police custody and about how he will recover if this happens.   Objective: Vital signs in last 24 hours: Temp:  [98.3 F (36.8 C)-99.2 F (37.3 C)] 99.2 F (37.3 C) (08/07 0525) Pulse Rate:  [78-80] 80 (08/07 0525) Resp:  [16-20] 20 (08/07 0525) BP: (128-141)/(72-84) 141/84 (08/07 0525) SpO2:  [100 %] 100 % (08/07 0525) Last BM Date:  (prior to admission )  Intake/Output from previous day: 08/06 0701 - 08/07 0700 In: 3566.3 [P.O.:360; I.V.:3106.3; IV Piggyback:100] Out: 1325 [Urine:1325] Intake/Output this shift: No intake/output data recorded.  PE: Gen:  Alert, NAD, pleasant Card:  Regular rate and rhythm, pedal pulses 2+ BL Pulm:  Normal effort, clear to auscultation bilaterally Abd: Soft, non-tender, non-distended MSK: ROM intact in bilateral shoulders. Splints present on bilateral forearms. Fingers are warm and well perfused and sensation intact bilaterally. AROM intact in left fingers, PROM intact in right fingers. ROM intact in bilateral ankles/feet. Sensation intact in bilateral lower extremities.  Skin: warm and dry, no rashes  Psych: A&Ox3   Lab Results:   Recent Labs  05/18/17 0530 05/18/17 0538 05/19/17 0404  WBC 8.7  --  10.5  HGB 13.1 13.6 11.6*  HCT 37.3* 40.0 32.9*  PLT 224  --  220   BMET  Recent Labs  05/18/17 0530 05/18/17 0538 05/19/17 0404  NA 139 144 137  K 3.1* 3.3* 3.9  CL 109 110 105  CO2 21*  --  26  GLUCOSE 100* 94 115*  BUN 11 12 8   CREATININE 0.93 0.90 0.93  CALCIUM 8.6*  --  8.5*   PT/INR  Recent Labs  05/18/17 0530  LABPROT 13.3  INR 1.01   CMP     Component Value Date/Time   NA 137 05/19/2017 0404   K 3.9 05/19/2017 0404   CL 105 05/19/2017 0404   CO2 26 05/19/2017 0404   GLUCOSE 115 (H) 05/19/2017 0404   BUN 8 05/19/2017 0404   CREATININE 0.93 05/19/2017 0404   CALCIUM 8.5 (L) 05/19/2017 0404   PROT 5.5 (L) 05/19/2017 0404   ALBUMIN 3.4 (L) 05/19/2017 0404   AST 118 (H) 05/19/2017 0404   ALT 35 05/19/2017 0404   ALKPHOS 35 (L) 05/19/2017 0404   BILITOT 1.3 (H) 05/19/2017 0404   GFRNONAA >60 05/19/2017 0404   GFRAA >60 05/19/2017 0404    Studies/Results: Dg Wrist Complete Left  Result Date: 05/19/2017 CLINICAL DATA:  Pain following fall EXAM: LEFT WRIST - COMPLETE 3+ VIEW COMPARISON:  None. FINDINGS: Frontal, oblique lateral, and ulnar deviation scaphoid images were obtained. There is a comminuted fracture of the distal radial metaphysis with fracture fragments extending into the radiocarpal joint. Alignment in this area is near anatomic. There is a fracture along the dorsal distal ulna medially with avulsion of the ulnar styloid. Alignment near anatomic in this area. No other fractures. No dislocation. Joint spaces appear unremarkable. There is marked elevation of the pronator quadratus fat pad, likely due to hemorrhage in this area. IMPRESSION: Comminuted fracture distal radial metaphysis with fracture fragments extending into the radiocarpal joint. Alignment overall near anatomic in this area. There is a fracture along the dorsal aspect of the distal ulna with avulsion of the ulnar styloid. Alignment in this area near anatomic. Electronically  Signed   By: Bretta BangWilliam  Woodruff III M.D.   On: 05/19/2017 09:55   Dg Wrist Complete Right  Result Date: 05/18/2017 CLINICAL DATA:  Postsurgical evaluation. EXAM: RIGHT WRIST - COMPLETE 3+ VIEW COMPARISON:  RIGHT wrist radiograph May 18, 2017 at 0555 hours FINDINGS: Interval T-plate and screw fixation of distal radial fracture, in alignment. Ulnar styloid fracture. No dislocation. No destructive bony lesions. Fiberglass cast obscures the fine bony detail. Soft tissue swelling and subcutaneous gas. IMPRESSION: Interval ORIF  distal radius, in alignment.  No dislocation. Electronically Signed   By: Awilda Metroourtnay  Bloomer M.D.   On: 05/18/2017 21:21   Dg C-arm 61-120 Min  Result Date: 05/18/2017 CLINICAL DATA:  ORIF of left femur fracture EXAM: DG C-ARM 61-120 MIN; LEFT FEMUR 2 VIEWS COMPARISON:  Preoperative 05/18/2017 exam FINDINGS: Nine C-arm fluoroscopic views during placement of a new left intramedullary long-stem femoral nail are provided. The femoral nail spans an acute proximal left femoral diaphyseal fracture with improved alignment. Pre-existing plate and screw fixation of the left femoral shaft is noted with out evidence hardware failure. Two cannulated screws traverse the trochanteric portion of the femur as well as 2 interlocking screws distally holding the femoral nail place. IMPRESSION: Placement of long-stem left femoral nail across a proximal left diaphyseal fracture of the femur. No immediate complications. Improved alignment noted. Electronically Signed   By: Tollie Ethavid  Kwon M.D.   On: 05/18/2017 18:40   Dg Femur Min 2 Views Left  Result Date: 05/18/2017 CLINICAL DATA:  ORIF of left femur fracture EXAM: DG C-ARM 61-120 MIN; LEFT FEMUR 2 VIEWS COMPARISON:  Preoperative 05/18/2017 exam FINDINGS: Nine C-arm fluoroscopic views during placement of a new left intramedullary long-stem femoral nail are provided. The femoral nail spans an acute proximal left femoral diaphyseal fracture with improved alignment. Pre-existing plate and screw fixation of the left femoral shaft is noted with out evidence hardware failure. Two cannulated screws traverse the trochanteric portion of the femur as well as 2 interlocking screws distally holding the femoral nail place. IMPRESSION: Placement of long-stem left femoral nail across a proximal left diaphyseal fracture of the femur. No immediate complications. Improved alignment noted. Electronically Signed   By: Tollie Ethavid  Kwon M.D.   On: 05/18/2017 18:40   Dg Femur Min 2 Views Left  Result Date:  05/18/2017 CLINICAL DATA:  ORIF of left femur fracture EXAM: DG C-ARM 61-120 MIN; LEFT FEMUR 2 VIEWS COMPARISON:  Preoperative 05/18/2017 exam FINDINGS: Nine C-arm fluoroscopic views during placement of a new left intramedullary long-stem femoral nail are provided. The femoral nail spans an acute proximal left femoral diaphyseal fracture with improved alignment. Pre-existing plate and screw fixation of the left femoral shaft is noted with out evidence hardware failure. Two cannulated screws traverse the trochanteric portion of the femur as well as 2 interlocking screws distally holding the femoral nail place. IMPRESSION: Placement of long-stem left femoral nail across a proximal left diaphyseal fracture of the femur. No immediate complications. Improved alignment noted. Electronically Signed   By: Tollie Ethavid  Kwon M.D.   On: 05/18/2017 18:40    Anti-infectives: Anti-infectives    Start     Dose/Rate Route Frequency Ordered Stop   05/19/17 1700  ceFAZolin (ANCEF) IVPB 1 g/50 mL premix     1 g 100 mL/hr over 30 Minutes Intravenous Every 8 hours 05/19/17 0906 05/21/17 1359   05/18/17 2000  ceFAZolin (ANCEF) IVPB 1 g/50 mL premix     1 g 100 mL/hr over 30 Minutes Intravenous Every 6  hours 05/18/17 1822 05/19/17 0830   05/18/17 0912  ceFAZolin (ANCEF) 2-4 GM/100ML-% IVPB    Comments:  Kathrene Bongo   : cabinet override      05/18/17 0912 05/18/17 1841   05/18/17 0530  ceFAZolin (ANCEF) IVPB 1 g/50 mL premix     1 g 100 mL/hr over 30 Minutes Intravenous  Once 05/18/17 0522 05/18/17 0656       Assessment/Plan Jump from 3rd floor balcony Open R distal radius FX - S/P ORIF and fasciotomies by Dr. Marilynn Latino subtroch femur FX - S/P ORIF and removal old hardware by Dr. Carola Frost, TDWB - ortho recommending lovenox x 21 days SI - No SI or desire to self-harm at this time. He agrees to notify staff if he feels this was again. L distal radius fracture with concurrent ulnar styloid fracture - short arm volar  splint - NWB in L wrist and hand - f/u with Dr. Melvyn Novas in 2 weeks  FEN - regular diet VTE - Lovenox ID - Ancef total 72h for open FX (8/5>>)   Dispo - PT/OT. Could possibly be discharged tomorrow after finishing course of antibiotics. Document in paper chart to call [employer] on anticipated discharge.   LOS: 2 days    Wells Guiles , [facility] 05/20/2017, 8:05 AM Pager: [phone] Trauma Pager: [phone] Mon-Fri 7:00 am-4:30 pm Sat-Sun 7:00 am-11:30 am

## 2017-05-20 NOTE — Progress Notes (Signed)
Physical Therapy Treatment Patient Details Name: Gerald Munoz MRN: [id number] DOB: [birth year] Today's Date: 05/20/2017    History of Present Illness Pt is a 21 yo male who fell from 3rd floor balcony. Pt sustained bilat wrist fractures (bilat NWB at wrist/hand) and L femur fracture, s/p ORIF, L LE TTWB.     PT Comments    Despite increased R UE and L LE pain with movement pt is progressing towards goals. Pt to strongly benefit from aggressive rehab setting however unfortunately patient will be incarcerated upon d/c from hospital. Unsure if pt will have any access to rehab/therapy once incarcerated. Focusing on transfers and how pt can advocate for himself and instruct others on helping him due to pt with bilat UE NWB and L LE TWB. Acute PT to con't to follow.   Follow Up Recommendations  Other (comment) (unsure what pt with have access too once incarcerated)     Equipment Recommendations  Wheelchair (measurements PT);Wheelchair cushion (measurements PT) (bilat platform RW)    Recommendations for Other Services       Precautions / Restrictions Precautions Precautions: Fall Required Braces or Orthoses: Other Brace/Splint Other Brace/Splint: R long forearm splint, L short arm splint. Restrictions Weight Bearing Restrictions: Yes RUE Weight Bearing: Weight bear through elbow only LUE Weight Bearing: Weight bear through elbow only LLE Weight Bearing: Touchdown weight bearing    Mobility  Bed Mobility Overal bed mobility: Needs Assistance Bed Mobility: Supine to Sit     Supine to sit: Max assist;+2 for physical assistance     General bed mobility comments: maxA for L LE mangement, with PT holding L LE pt able to push up with R LE and lift bottom up and move to L side of bed. pt remains to require maxA for trunk elevation as well, able to prop self up on elbows but unable to achieve full long sit position  Transfers Overall transfer level: Needs  assistance Equipment used: 2 person hand held assist Transfers: Sit to/from [employer]'sStand;Stand Pivot Transfers Sit to Stand: Max assist;+2 physical assistance Stand pivot transfers: Max assist;+2 physical assistance       General transfer comment: pt able to pull self up via locking elbows with PT and PT tech and is able to pull self into standing while maintaining L LE TWB. v/c's to pvt on ball of R foot to chair. completed 2 transfers  Ambulation/Gait             General Gait Details: unable   Stairs            Wheelchair Mobility    Modified Rankin (Stroke Patients Only)       Balance Overall balance assessment: Needs assistance Sitting-balance support: Feet supported;No upper extremity supported Sitting balance-Leahy Scale: Fair Sitting balance - Comments: limtied by pain   Standing balance support: Bilateral upper extremity supported Standing balance-Leahy Scale: Zero Standing balance comment: dependent on external support                            Cognition Arousal/Alertness: Awake/alert Behavior During Therapy: WFL for tasks assessed/performed Overall Cognitive Status: Within Functional Limits for tasks assessed                                        Exercises General Exercises - Lower Extremity Ankle Circles/Pumps: AROM;Both;10 reps;Seated Quad Sets: AROM;Left;5 reps;Supine (  with 5 sec hold) Gluteal Sets: AROM;10 reps;Supine Long Arc Quad: AAROM;Left;10 reps;Seated Heel Slides: AAROM;Left;10 reps;Supine    General Comments General comments (skin integrity, edema, etc.): educated pt on importance of ROM to L LE to assist with the swelling and feeling of tightness. pt agreed L LE felt better after ROM      Pertinent Vitals/Pain Pain Assessment: 0-10 Pain Score: 7  (7 - R wrsit, 2 in LEs when not moving) Pain Descriptors / Indicators: Constant;Discomfort;Grimacing;Guarding;Throbbing;Moaning Pain Intervention(s): Monitored  during session    Home Living                      Prior Function            PT Goals (current goals can now be found in the care plan section) Acute Rehab PT Goals Patient Stated Goal: stop the pain Progress towards PT goals: Progressing toward goals    Frequency    Min 5X/week      PT Plan Other (comment) (pt will be incarcerated upon d/c from hospital)    Co-evaluation              AM-PAC PT "6 Clicks" Daily Activity  Outcome Measure  Difficulty turning over in bed (including adjusting bedclothes, sheets and blankets)?: Total Difficulty moving from lying on back to sitting on the side of the bed? : Total Difficulty sitting down on and standing up from a chair with arms (e.g., wheelchair, bedside commode, etc,.)?: Total Help needed moving to and from a bed to chair (including a wheelchair)?: Total Help needed walking in hospital room?: Total Help needed climbing 3-5 steps with a railing? : Total 6 Click Score: 6    End of Session   Activity Tolerance: Patient limited by pain Patient left: in chair;with call bell/phone within reach Nurse Communication: Mobility status;Patient requests pain meds PT Visit Diagnosis: Difficulty in walking, not elsewhere classified (R26.2);Pain Pain - Right/Left: Right Pain - part of body: Leg     Time: 1610-9604 PT Time Calculation (min) (ACUTE ONLY): 53 min  Charges:  $Therapeutic Exercise: 23-37 mins $Therapeutic Activity: 23-37 mins                    G Codes:       Lewis Shock, PT, DPT Pager #: [phone] Office #: [phone]    Gerald Munoz 05/20/2017, 12:20 PM
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
## 2017-05-20 NOTE — Progress Notes (Signed)
Spoke with April Hancock with [facility] (the agency which provides medical treatment in the jail).  We discussed pt's current condition and injuries.  Based on our conversation, she feels that pt will need to go [facility] [place] until he is medically stable to [place] [facility].  Faxed pt's clinical information to April at [phone], at her request.  I have received order to disclose medical information from police attorney, Deirdre Priestolly Sizemore.  Will leave order on patient chart.    Quintella BatonJulie W. Kapil Petropoulos, RN, BSN  Trauma/Neuro ICU Case Manager [phone]
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
## 2017-05-20 NOTE — Progress Notes (Addendum)
Multiple attempts made to contact Sgt. Matt Menshew, detective connected with pt's criminal case, to discuss pt disposition.  Call went straight to voicemail, which has not been set up.  Called [place] Police [employer]; they state Opal SidlesSgt Menshew is off duty until Saturday.  Per PA, pt may be dc within next 1-2 days.  Called Watchcommander, Quentin Cornwallaptain Holder, who states she is currently speaking with police attorney to determine where pt will go at discharge.  She states pt may need to go to medical corrections facility at discharge, [facility] [place].  She will confirm this and call Case Manager back.    Addendum:  8119:  1431  [facility] call from [facility], police attorney:  She will email me an order to send pt's health information to Sheriff's office to help determine which facility will best serve pt at discharge.  Will send clinical information once order received.    Quintella BatonJulie W. Valoree Agent, RN, BSN  Trauma/Neuro ICU Case Manager [phone]

## 2017-05-20 NOTE — Progress Notes (Signed)
Occupational Therapy Treatment Patient Details Name: Gerald Munoz MRN: [id number] DOB: [birth year] Today's Date: 05/20/2017    History of present illness Pt is a 21 yo male who fell from 3rd floor balcony. Pt sustained bilat wrist fractures (bilat NWB at wrist/hand) and L femur fracture, s/p ORIF, L LE TTWB.    OT comments  Pt progressing towards established OT goals. Pt performed toilet transfer with Max A +2 from recliner to [facility]. Educated pt on LB dressing and pt donned shorts with Mod A +2.  Provided pt with built up handle to increase independence with self feeding. Will continue to follow acutely to increase pt independence with ADLs and functional mobility. Unsure of current dc plan and continue to recommend post-acute rehab to optimize functional performance and safety.   Follow Up Recommendations  Home health OT;Supervision/Assistance - 24 hour (Pending pt progress)    Equipment Recommendations  3 in 1 bedside commode    Recommendations for Other Services PT consult    Precautions / Restrictions Precautions Precautions: Fall Required Braces or Orthoses: Other Brace/Splint Other Brace/Splint: R long forearm splint, L short arm splint. Restrictions Weight Bearing Restrictions: Yes RUE Weight Bearing: Weight bear through elbow only LUE Weight Bearing: Weight bear through elbow only LLE Weight Bearing: Touchdown weight bearing       Mobility Bed Mobility Overal bed mobility: Needs Assistance Bed Mobility: Sit to Supine       Sit to supine: Max assist;+2 for physical assistance   General bed mobility comments: maxA for L LE mangement and lower trunk to bed. Pt able to use bil elbows and RLE to move self towards [facility] with Mod A +2 pulling bed pad to transition hips;  Transfers Overall transfer level: Needs assistance Equipment used: 2 person hand held assist Transfers: Sit to/from [employer] Sit to Stand: Max assist;+2 physical  assistance Stand pivot transfers: Max assist;+2 physical assistance       General transfer comment: pt able to pull self up via locking elbows with PT and PT tech and is able to pull self into standing while maintaining L LE TWB. v/c's to pvt on ball of R foot to chair. completed 2 transfers    Balance Overall balance assessment: Needs assistance Sitting-balance support: Feet supported;No upper extremity supported Sitting balance-Leahy Scale: Fair Sitting balance - Comments: limtied by pain   Standing balance support: Bilateral upper extremity supported Standing balance-Leahy Scale: Zero Standing balance comment: dependent on external support                           ADL either performed or assessed with clinical judgement   ADL Overall ADL's : Needs assistance/impaired Eating/Feeding: Sitting;Set up Eating/Feeding Details (indicate cue type and reason): Provided pt with built up handle to increase independence with self feeding. Pt demonstrated understanding and was able to bring food to mouth with fork using L hand. Pt also demonstrated ability to bring cup with straw to mouth.  Grooming: Sitting;Minimal assistance Grooming Details (indicate cue type and reason): Educated pt that built up handle may be used for grooming tools Upper Body Bathing: Sitting;Moderate assistance   Lower Body Bathing: Sit to/from stand;+2 for physical assistance;Moderate assistance   Upper Body Dressing : Sitting;Moderate assistance Upper Body Dressing Details (indicate cue type and reason): Educated pt on donning shirt and compensatory techniques. Pt verbalized understanding Lower Body Dressing: Sit to/from stand;+2 for physical assistance;Moderate assistance Lower Body Dressing Details (indicate cue type  and reason): Pt donned pants with Mod A +2. Needs physical assist to bring pants over his feet. Able to grab waist band with L hand and bring pants up to mid-thigh. Requires Assistance to  pull pants over hips Toilet Transfer: Maximal assistance;+2 for physical assistance;Stand-pivot (Simulated to recliner)             General ADL Comments: Educated pt on compensatory techniques for feeding, toilet transfers, LB ADLs, and UB dressing. Pt continues to be limited by pain but is motivated to participate in therapy and increase his functional performance.     Vision       Perception     Praxis      Cognition Arousal/Alertness: Awake/alert Behavior During Therapy: WFL for tasks assessed/performed Overall Cognitive Status: Within Functional Limits for tasks assessed                                          Exercises Exercises: General Lower Extremity;General Upper Extremity General Exercises - Upper Extremity Shoulder Flexion: AAROM;10 reps;Supine;Both Shoulder Extension: AAROM;10 reps;Supine;Both Shoulder Horizontal ABduction: AAROM;Right;10 reps;Supine Shoulder Horizontal ADduction: AAROM;Right;10 reps;Supine Elbow Flexion: AROM;Left;5 reps;Seated Elbow Extension: AROM;Left;5 reps;Seated Digit Composite Flexion: AROM;Both;5 reps;Supine Composite Extension: AROM;Right;5 reps;Supine General Exercises - Lower Extremity Ankle Circles/Pumps: AROM;Both;10 reps;Seated Quad Sets: AROM;Left;5 reps;Supine (with 5 sec hold) Gluteal Sets: AROM;10 reps;Supine Long Arc Quad: AAROM;Left;10 reps;Seated   Shoulder Instructions       General Comments Educated pt on positioning, ROM, and edema management. Pt verbalized understanding    Pertinent Vitals/ Pain       Pain Assessment: Faces Faces Pain Scale: Hurts even more Pain Location: L leg 6/10; r arm 10/10 Pain Descriptors / Indicators: Constant;Discomfort;Grimacing;Guarding;Throbbing;Moaning Pain Intervention(s): Monitored during session;Limited activity within patient's tolerance;Repositioned  Home Living                                          Prior Functioning/Environment               Frequency  Min 2X/week        Progress Toward Goals  OT Goals(current goals can now be found in the care plan section)  Progress towards OT goals: Progressing toward goals  Acute Rehab OT Goals Patient Stated Goal: stop the pain OT Goal Formulation: With patient Time For Goal Achievement: 06/02/17 Potential to Achieve Goals: Good ADL Goals Pt Will Perform Eating: with min assist;with caregiver independent in assisting;with adaptive utensils;sitting Pt Will Perform Upper Body Dressing: with min assist;with caregiver independent in assisting;sitting Pt Will Perform Lower Body Dressing: with mod assist;with caregiver independent in assisting;sit to/from stand Pt Will Transfer to Toilet: with mod assist;stand pivot transfer;bedside commode;ambulating  Plan Discharge plan remains appropriate    Co-evaluation                 AM-PAC PT "6 Clicks" Daily Activity     Outcome Measure   Help from another person eating meals?: A Little Help from another person taking care of personal grooming?: A Little Help from another person toileting, which includes using toliet, bedpan, or urinal?: A Lot Help from another person bathing (including washing, rinsing, drying)?: A Lot Help from another person to put on and taking off regular upper body clothing?: A Little Help from another person to put  on and taking off regular lower body clothing?: A Lot 6 Click Score: 15    End of Session Equipment Utilized During Treatment: Gait belt  OT Visit Diagnosis: Unsteadiness on feet (R26.81);Other abnormalities of gait and mobility (R26.89);Muscle weakness (generalized) (M62.81);Pain Pain - Right/Left:  (Bilateral) Pain - part of body: Leg;Hand;Arm (Bil wrists; L leg)   Activity Tolerance Patient limited by pain   Patient Left with call bell/phone within reach;in bed;with family/visitor present   Nurse Communication Mobility status;Weight bearing status;Precautions         Time: [id number] OT Time Calculation (min): 27 min  Charges: OT General Charges $OT Visit: 1 Procedure OT Treatments $Self Care/Home Management : 23-37 mins  Trew Sunde MSOT, OTR/L Acute Rehab Pager: [phone] Office: [phone]   Theodoro GristCharis M Geary Rufo 05/20/2017, 4:23 PM

## 2017-05-21 MED ORDER — TRAMADOL HCL 50 MG PO TABS: 50.0000 mg | ORAL_TABLET | Freq: Four times a day (QID) | ORAL | 0 refills | Status: DC

## 2017-05-21 MED ORDER — ENOXAPARIN SODIUM 40 MG/0.4ML ~~LOC~~ SOLN: 40.0000 mg | SUBCUTANEOUS | 0 refills | Status: DC

## 2017-05-21 MED ORDER — OXYCODONE HCL 5 MG PO TABS: 5.0000 mg | ORAL_TABLET | ORAL | 0 refills | Status: DC | PRN

## 2017-05-21 MED ORDER — METHOCARBAMOL 500 MG PO TABS: 1000.0000 mg | ORAL_TABLET | Freq: Four times a day (QID) | ORAL | 0 refills | Status: DC | PRN
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
## 2017-05-21 NOTE — Progress Notes (Signed)
Physical Therapy Note - durable medical equipment      Durable Medical Equipment        Start     Ordered   05/21/17 1130  For home use only DME standard manual wheelchair with seat cushion  Once    Comments:  Patient suffers from left femur fracture and bilateral wrist fractures which impairs their ability to perform daily activities like bathing, dressing, feeding, grooming and toileting in the home.  A walker will not resolve  issue with performing activities of daily living. A wheelchair will allow patient to safely perform daily activities. Patient can safely propel the wheelchair in the home or has a caregiver who can provide assistance.  Accessories: elevating leg rests (ELRs), wheel locks, extensions and anti-tippers.   05/21/17 1131   05/21/17 1129  For home use only DME 3 n 1  Once     05/21/17 1131   05/21/17 1129  For home use only DME Walker platform  Once    Comments:  Bilateral platforms - patient has bilateral wrist fractures  Question:  Patient needs a walker to treat with the following condition  Answer:  Closed left subtrochanteric femur fracture (HCC)   05/21/17 1131     Lewis ShockAshly Darneshia Demary, PT, DPT Pager #: [phone] Office #: [phone]

## 2017-05-21 NOTE — Progress Notes (Signed)
Physical Therapy Treatment Patient Details Name: Gerald Munoz MRN: [id number] DOB: January 10, 1996 Today's Date: 05/21/2017    History of Present Illness Pt is a 21 yo male who fell from 3rd floor balcony. Pt sustained bilat wrist fractures (bilat NWB at wrist/hand) and L femur fracture, s/p ORIF, L LE TTWB.     PT Comments    Pt with improved transfer ability this date and was even able to amb 3' with bilat platform walker. Educated mother and sister on transfer techniques and L LE exercises. Mother and sister both good return demonstration. Acute PT to con't to follow   Follow Up Recommendations   pt going to courthouse upon d/c for possible incarceration     Equipment Recommendations  3in1 (PT);Wheelchair (measurements PT);Wheelchair cushion (measurements PT) (bilat platform walker)    Recommendations for Other Services       Precautions / Restrictions Precautions Precautions: Fall Required Braces or Orthoses: Other Brace/Splint Other Brace/Splint: R long forearm splint, L short arm splint. Restrictions Weight Bearing Restrictions: Yes RUE Weight Bearing: Weight bear through elbow only LUE Weight Bearing: Weight bear through elbow only LLE Weight Bearing: Touchdown weight bearing    Mobility  Bed Mobility Overal bed mobility: Needs Assistance Bed Mobility: Sit to Supine;Supine to Sit     Supine to sit: Mod assist;+2 for physical assistance Sit to supine: Mod assist;+2 for physical assistance   General bed mobility comments: pt able to elevate trunk using abdominal muscles, modA for L LE management due to pain, pt able to push up with R LE to scoot bottom towards edge of bed, modA   Transfers Overall transfer level: Needs assistance Equipment used: 2 person hand held assist (locked elbows with PT and tech) Transfers: Sit to/from [employer];Stand Pivot Transfers Sit to Stand: Min assist;Mod assist;+2 physical assistance Stand pivot transfers: Min assist;Mod  assist;+2 physical assistance       General transfer comment: pt with improved ability to push up with R LE and manage L LE. pt required minAx2 to balance self during std pvt but was able to pvt without difficulty and was able to maintain L LE TWB. Pt's mother and sister present and was able to complete sit to stand and stand pvt transfer safely with patient directing sequencing.   Ambulation/Gait Ambulation/Gait assistance: Min assist;+2 safety/equipment Ambulation Distance (Feet): 3 Feet Assistive device: Bilateral platform walker Gait Pattern/deviations: Step-to pattern Gait velocity: slow Gait velocity interpretation: Below normal speed for age/gender General Gait Details: pt able to maintain L LE TWB, amb limited by L LE pain. pt able to tolerate bilat elbow WBing   Stairs            Wheelchair Mobility    Modified Rankin (Stroke Patients Only)       Balance Overall balance assessment: Needs assistance Sitting-balance support: Feet supported;No upper extremity supported Sitting balance-Leahy Scale: Fair     Standing balance support: Bilateral upper extremity supported Standing balance-Leahy Scale: Zero Standing balance comment: dependent on external support                            Cognition Arousal/Alertness: Awake/alert Behavior During Therapy: WFL for tasks assessed/performed Overall Cognitive Status: Within Functional Limits for tasks assessed                                        Exercises  General Exercises - Lower Extremity Ankle Circles/Pumps: AROM;Both;10 reps;Seated Quad Sets: AROM;Left;10 reps;Supine Heel Slides: AAROM;Left;10 reps;Supine Hip ABduction/ADduction: AAROM;Left;10 reps;Supine    General Comments General comments (skin integrity, edema, etc.): educated mother on L LE AAROM and exercises. mother with good return demonstration       Pertinent Vitals/Pain Pain Assessment: 0-10 Pain Score: 7  Pain  Location: L LEG went from 7-10 Pain Descriptors / Indicators: Constant Pain Intervention(s): Monitored during session    Home Living                      Prior Function            PT Goals (current goals can now be found in the care plan section) Progress towards PT goals: Progressing toward goals    Frequency    Min 5X/week      PT Plan Other (comment) (pt going to court house)    Co-evaluation              AM-PAC PT "6 Clicks" Daily Activity  Outcome Measure  Difficulty turning over in bed (including adjusting bedclothes, sheets and blankets)?: Total Difficulty moving from lying on back to sitting on the side of the bed? : Total Difficulty sitting down on and standing up from a chair with arms (e.g., wheelchair, bedside commode, etc,.)?: Total Help needed moving to and from a bed to chair (including a wheelchair)?: Total Help needed walking in hospital room?: Total Help needed climbing 3-5 steps with a railing? : Total 6 Click Score: 6    End of Session Equipment Utilized During Treatment: Gait belt Activity Tolerance: Patient limited by pain Patient left: in bed;with call bell/phone within reach;with family/visitor present Nurse Communication: Mobility status;Patient requests pain meds PT Visit Diagnosis: Difficulty in walking, not elsewhere classified (R26.2);Pain Pain - Right/Left: Left Pain - part of body: Leg     Time: 6045-4098 PT Time Calculation (min) (ACUTE ONLY): 43 min  Charges:  $Therapeutic Exercise: 8-22 mins $Therapeutic Activity: 23-37 mins                    G Codes:       Lewis Shock, PT, DPT Pager #: [phone] Office #: [phone]    Elmo Shumard M Eliga Arvie 05/21/2017, 2:35 PM

## 2017-05-21 NOTE — Progress Notes (Signed)
D/c instructions reviewed with pt and his mother. Copy of instructions given to pt/mother and copy provided to transporters to take to the facility[facility] [place]. [employer] and [place] officers here to escort pt to courthouse and to prison as per case manager's progress note. Pt d/c'd with belongings, [facility] transporter had scripts in pt's packet of information for facility. Knee high ted hose provided to go with pt to be applied to LLE, not able to apply prior to pt departure.

## 2017-05-21 NOTE — Discharge Summary (Signed)
Physician Discharge Summary  Patient ID: Gerald Munoz MRN: [id number] DOB/AGE: [birth year] 22 y.o.  Admit date: 05/18/2017 Discharge date: 05/21/2017  Discharge Diagnoses Left femur fracture Left distal radius fracture Left ulnar styloid fracture Right comminuted fracture for the distal radius and ulnar styloid - s/p ORIF  Consultants Orthopedic Surgery - Dr. Myrene GalasMichael Handy Hand Surgery - Dr. Bradly BienenstockFred Ortmann  Procedures ORIF right wrist with fasciotomy - 05/18/17 Dr. Bradly BienenstockFred Ortmann IM nailing of left subtrochanteric femur, removal of previous hardware - 05/18/17 Dr. Myrene GalasMichael Handy  HPI: Patient is a 21 yo male who jumped from a 3rd story balcony running from the police. [facility] He complained of right arm pain and left leg pain. He was not amnestic to the event and had no other complaints at that time. Workup in the ED revealed left femur fracture and right wrist fractures. Hand surgery and orthopedic surgery were consulted.   Hospital Course: Patient was admitted to the trauma service. Patient was taken to the OR with Dr. Melvyn Novasrtmann and Dr. Carola FrostHandy for ORIF of right wrist and IM nailing of left femur and tolerated well.  Patient's post-operative films showed stable fixation of left femur and right wrist. Patient allowed touchdown weight bearing on LLE with ROM as tolerated. Patient made non-weight bearing on bilateral upper extremities. 05/19/17 patient complained of left wrist pain as well, x-rays showed distal radius fracture and ulnar styloid fracture. Dr. Melvyn Novasrtmann was notified of left wrist fractures and recommended volar splint with outpatient follow up as management. PT/OT saw and evaluated patient and recommended home health PT and OT with a wheelchair. Patient is unable to propel himself in a wheelchair due to injuries in bilateral upper extremities. On 05/21/17 patient was tolerating a diet, voiding appropriately, progressing with therapies, and pain well controlled. Patient to  follow up with orthopedic surgery in 10-14 days and hand surgery 2 weeks post-op. Patient is discharged into police custody in stable condition.   Patient completed 72 hrs of IV ancef as antibiotic prophylaxis. Per orthopedic surgery recommendation patient needs to be on lovenox 40 mg subcutaneous injection once daily for a total of 21 days. Patient's pain is currently well controlled on tylenol 650 mg PO q8 hrs, tramadol 50 mg PO q6 hrs as needed, oxycodone IR 5-10 mg q4 hrs as needed, and methocarbamol 1000 mg PO q6 hrs as needed. He will need continued PT and OT to regain functional abilities.   Physical Exam: Vitals: Blood pressure 138/74, pulse 69, temperature 98.5 F (36.9 C), temperature source Oral, resp. rate 18, height 5\' 9"  (1.753 m), weight 54.4 kg (120 lb), SpO2 100 %. Gen:  Alert, NAD, pleasant Card:  Regular rate and rhythm, pedal pulses 2+ BL Pulm:  Normal effort, clear to auscultation bilaterally Abd: Soft, non-tender, non-distended, normoactive bowel sounds MSK: ROM intact in bilateral shoulders. Splints present on bilateral forearms. Fingers are warm and well perfused and sensation intact bilaterally. AROM intact in left fingers, PROM intact in right fingers. ROM intact in bilateral ankles/feet. Sensation intact in bilateral lower extremities.  Skin: warm and dry, no rashes  Psych: A&Ox3   I have personally looked this patient up in the [place] Controlled Substance Database and reviewed their medications prior to prescribing narcotic pain medication.  Allergies as of 05/21/2017   No Known Allergies     Medication List    TAKE these medications   enoxaparin 40 MG/0.4ML injection Commonly known as:  LOVENOX Inject 0.4 mLs (40 mg total) into  the skin daily.   methocarbamol 500 MG tablet Commonly known as:  ROBAXIN Take 2 tablets (1,000 mg total) by mouth every 6 (six) hours as needed for muscle spasms.   oxyCODONE 5 MG immediate release tablet Commonly known as:  Oxy  IR/ROXICODONE Take 1-2 tablets (5-10 mg total) by mouth every 4 (four) hours as needed for breakthrough pain (5mg  for mild pain, 10mg  for moderate pain, 15mg  for severe pain).   traMADol 50 MG tablet Commonly known as:  ULTRAM Take 1 tablet (50 mg total) by mouth every 6 (six) hours.            Durable Medical Equipment        Start     Ordered   05/21/17 1130  For home use only DME standard manual wheelchair with seat cushion  Once    Comments:  Patient suffers from left femur fracture and bilateral wrist fractures which impairs their ability to perform daily activities like bathing, dressing, feeding, grooming and toileting in the home.  A walker will not resolve  issue with performing activities of daily living. A wheelchair will allow patient to safely perform daily activities. Patient can safely propel the wheelchair in the home or has a caregiver who can provide assistance.  Accessories: elevating leg rests (ELRs), wheel locks, extensions and anti-tippers.   05/21/17 1131   05/21/17 1129  For home use only DME 3 n 1  Once     05/21/17 1131   05/21/17 1129  For home use only DME Walker platform  Once    Comments:  Bilateral platforms - patient has bilateral wrist fractures  Question:  Patient needs a walker to treat with the following condition  Answer:  Closed left subtrochanteric femur fracture (HCC)   05/21/17 1131       Follow-up Information    Bradly Bienenstock, MD. Call.   Specialty:  Orthopedic Surgery Why:  Call when you leave the hospital and make an appointment to follow up in 2 weeks.  Contact information: [address] [place] [address] [phone]        Myrene Galas, MD Follow up.   Specialty:  Orthopedic Surgery Why:  Call and make an appointment to follow up in 10-14 days.  Contact information: [address] [place] [address] [phone]        [facility] [place]. Call.   Why:  Call as needed with  questions or concerns, you do not need a follow up appointment.  Contact information: [address] [place] [address] [phone]          Signed: Wells Guiles , [facility] 05/21/2017, 11:42 AM Pager: [phone] Trauma: [phone] Mon-Fri 7:00 am-4:30 pm Sat-Sun 7:00 am-11:30 am

## 2017-05-21 NOTE — Discharge Instructions (Signed)
Hip Fracture A hip fracture is a fracture of the upper part of your thigh bone (femur). What are the causes? A hip fracture is caused by a direct blow to the side of your hip. This is usually the result of a fall but can occur in other circumstances, such as an automobile accident. What increases the risk? There is an increased risk of hip fractures in people with:  An unsteady walking pattern (gait) and those with conditions that contribute to poor balance, such as Parkinson's disease or dementia.  Osteopenia and osteoporosis.  Cancer that spreads to the leg bones.  Certain metabolic diseases.  What are the signs or symptoms? Symptoms of hip fracture include:  Pain over the injured hip.  Inability to put weight on the leg in which the fracture occurred (although, some patients are able to walk after a hip fracture).  Toes and foot of the affected leg point outward when you lie down.  How is this diagnosed? A physical exam can determine if a hip fracture is likely to have occurred. X-ray exams are needed to confirm the fracture and to look for other injuries. The X-ray exam can help to determine the type of hip fracture. Rarely, the fracture is not visible on an X-ray image and a CT scan or MRI will have to be done. How is this treated? The treatment for a fracture is usually surgery. This means using a screw, nail, or rod to hold the bones in place. Follow these instructions at home: Take all medicines as directed by your health care provider. Contact a health care provider if: Pain continues, even after taking pain medicine. This information is not intended to replace advice given to you by your health care provider. Make sure you discuss any questions you have with your health care provider. Document Released: 09/30/2005 Document Revised: 03/07/2016 Document Reviewed: 05/12/2013 Elsevier Interactive Patient Education  2017 Elsevier Inc.  

## 2017-05-21 NOTE — Progress Notes (Signed)
Pt medically stable for discharge to correctional facility today.  Notified Polly Sizemore, police department attorney of patient dc.  She will notify [employer] and coordinate transportation with [facility] for transport to courthouse and then to [facility] [place].  Left message with April Hancock with [employer] to follow up on patient's discharge needs.  Bedside nurse Tammy notified of plan for 12 noon pickup.  Quintella BatonJulie W. Ark Agrusa, RN, BSN  Trauma/Neuro ICU Case Manager [phone]

## 2017-05-22 DIAGNOSIS — S7225XD Nondisplaced subtrochanteric fracture of left femur, subsequent encounter for closed fracture with routine healing: Secondary | ICD-10-CM | POA: Diagnosis not present

## 2017-05-22 DIAGNOSIS — S52612D Displaced fracture of left ulna styloid process, subsequent encounter for closed fracture with routine healing: Secondary | ICD-10-CM | POA: Diagnosis not present

## 2017-05-22 DIAGNOSIS — S52392D Other fracture of shaft of radius, left arm, subsequent encounter for closed fracture with routine healing: Secondary | ICD-10-CM | POA: Diagnosis not present

## 2017-05-22 DIAGNOSIS — S7292XA Unspecified fracture of left femur, initial encounter for closed fracture: Secondary | ICD-10-CM | POA: Diagnosis not present

## 2017-05-26 ENCOUNTER — Telehealth (HOSPITAL_COMMUNITY): Payer: Self-pay

## 2017-05-26 NOTE — Telephone Encounter (Signed)
[phone] Has no feeling in his testicles and penis. Please call mom

## 2017-05-26 NOTE — Telephone Encounter (Signed)
Patient's mother called and reports that patient is experiencing numbness in penis and testicles. No swelling, redness, penile discharge, fevers, or urinary symptoms. Patient has follow up with Dr. Carola FrostHandy for femur next week. Advised that patient could have some nerve injury and would recommend bringing this up at follow up next week. Mother expressed understanding and agreement with this.   Wells GuilesKelly Rayburn , [facility] 05/26/2017, 3:32 PM Pager: [phone] Mon-Fri 7:00 am-4:30 pm Sat-Sun 7:00 am-11:30 am

## 2017-06-02 DIAGNOSIS — S7225XD Nondisplaced subtrochanteric fracture of left femur, subsequent encounter for closed fracture with routine healing: Secondary | ICD-10-CM | POA: Diagnosis not present

## 2017-06-02 DIAGNOSIS — S72322D Displaced transverse fracture of shaft of left femur, subsequent encounter for closed fracture with routine healing: Secondary | ICD-10-CM | POA: Diagnosis not present

## 2017-06-02 DIAGNOSIS — S52612D Displaced fracture of left ulna styloid process, subsequent encounter for closed fracture with routine healing: Secondary | ICD-10-CM | POA: Diagnosis not present

## 2017-06-02 DIAGNOSIS — S52392D Other fracture of shaft of radius, left arm, subsequent encounter for closed fracture with routine healing: Secondary | ICD-10-CM | POA: Diagnosis not present

## 2017-06-02 DIAGNOSIS — T84195D Other mechanical complication of internal fixation device of left femur, subsequent encounter: Secondary | ICD-10-CM | POA: Diagnosis not present

## 2017-06-05 DIAGNOSIS — S52572A Other intraarticular fracture of lower end of left radius, initial encounter for closed fracture: Secondary | ICD-10-CM | POA: Diagnosis not present

## 2017-06-05 DIAGNOSIS — S7225XD Nondisplaced subtrochanteric fracture of left femur, subsequent encounter for closed fracture with routine healing: Secondary | ICD-10-CM | POA: Diagnosis not present

## 2017-06-05 DIAGNOSIS — S52612D Displaced fracture of left ulna styloid process, subsequent encounter for closed fracture with routine healing: Secondary | ICD-10-CM | POA: Diagnosis not present

## 2017-06-05 DIAGNOSIS — S52392D Other fracture of shaft of radius, left arm, subsequent encounter for closed fracture with routine healing: Secondary | ICD-10-CM | POA: Diagnosis not present

## 2017-06-09 DIAGNOSIS — S52612D Displaced fracture of left ulna styloid process, subsequent encounter for closed fracture with routine healing: Secondary | ICD-10-CM | POA: Diagnosis not present

## 2017-06-09 DIAGNOSIS — S52392D Other fracture of shaft of radius, left arm, subsequent encounter for closed fracture with routine healing: Secondary | ICD-10-CM | POA: Diagnosis not present

## 2017-06-09 DIAGNOSIS — S7225XD Nondisplaced subtrochanteric fracture of left femur, subsequent encounter for closed fracture with routine healing: Secondary | ICD-10-CM | POA: Diagnosis not present

## 2017-06-13 DIAGNOSIS — S52392D Other fracture of shaft of radius, left arm, subsequent encounter for closed fracture with routine healing: Secondary | ICD-10-CM | POA: Diagnosis not present

## 2017-06-13 DIAGNOSIS — S7225XD Nondisplaced subtrochanteric fracture of left femur, subsequent encounter for closed fracture with routine healing: Secondary | ICD-10-CM | POA: Diagnosis not present

## 2017-06-13 DIAGNOSIS — S52612D Displaced fracture of left ulna styloid process, subsequent encounter for closed fracture with routine healing: Secondary | ICD-10-CM | POA: Diagnosis not present

## 2017-06-17 DIAGNOSIS — S52392D Other fracture of shaft of radius, left arm, subsequent encounter for closed fracture with routine healing: Secondary | ICD-10-CM | POA: Diagnosis not present

## 2017-06-17 DIAGNOSIS — S52612D Displaced fracture of left ulna styloid process, subsequent encounter for closed fracture with routine healing: Secondary | ICD-10-CM | POA: Diagnosis not present

## 2017-06-17 DIAGNOSIS — S7225XD Nondisplaced subtrochanteric fracture of left femur, subsequent encounter for closed fracture with routine healing: Secondary | ICD-10-CM | POA: Diagnosis not present

## 2017-06-19 ENCOUNTER — Encounter: Payer: Self-pay | Admitting: Urgent Care

## 2017-06-19 ENCOUNTER — Ambulatory Visit (INDEPENDENT_AMBULATORY_CARE_PROVIDER_SITE_OTHER): Payer: 59 | Admitting: Urgent Care

## 2017-06-19 VITALS — BP 114/70 | HR 83 | Temp 98.5°F | Resp 16 | Ht 69.0 in | Wt 120.0 lb

## 2017-06-19 DIAGNOSIS — N529 Male erectile dysfunction, unspecified: Secondary | ICD-10-CM

## 2017-06-19 DIAGNOSIS — S52612D Displaced fracture of left ulna styloid process, subsequent encounter for closed fracture with routine healing: Secondary | ICD-10-CM | POA: Diagnosis not present

## 2017-06-19 DIAGNOSIS — N50819 Testicular pain, unspecified: Secondary | ICD-10-CM | POA: Diagnosis not present

## 2017-06-19 DIAGNOSIS — S52392D Other fracture of shaft of radius, left arm, subsequent encounter for closed fracture with routine healing: Secondary | ICD-10-CM | POA: Diagnosis not present

## 2017-06-19 DIAGNOSIS — N5089 Other specified disorders of the male genital organs: Secondary | ICD-10-CM

## 2017-06-19 DIAGNOSIS — N509 Disorder of male genital organs, unspecified: Secondary | ICD-10-CM | POA: Diagnosis not present

## 2017-06-19 DIAGNOSIS — S7225XD Nondisplaced subtrochanteric fracture of left femur, subsequent encounter for closed fracture with routine healing: Secondary | ICD-10-CM | POA: Diagnosis not present

## 2017-06-19 LAB — POCT URINALYSIS DIP (MANUAL ENTRY)
BILIRUBIN UA: NEGATIVE
Blood, UA: NEGATIVE
Glucose, UA: NEGATIVE mg/dL
Ketones, POC UA: NEGATIVE mg/dL
LEUKOCYTES UA: NEGATIVE
Nitrite, UA: NEGATIVE
PH UA: 8 (ref 5.0–8.0)
Protein Ur, POC: NEGATIVE mg/dL
Spec Grav, UA: 1.015 (ref 1.010–1.025)
Urobilinogen, UA: 0.2 E.U./dL

## 2017-06-19 NOTE — Progress Notes (Signed)
    MRN: [id number] DOB: 1996-02-04  Subjective:   Gerald Munoz A Dilks is a 21 y.o. male presenting for chief complaint of Testicle Pain  Reports suffering a fall from balcony 05/18/2017, had multiple fractures including femur, bilateral wrist fractures. Also had numbness of testicular area, now having soreness of the testicles and at the border of the glans of the penis. He is also having abnormal erections. Denies fever, dysuria, urinary frequency, hematuria, difficulty urinating, genital rashes, testicular swelling, penile discharge, incontinence, low back pain.   [name] has a current medication list which includes the following prescription(s): ibuprofen and oxycodone. Also has No Known Allergies.  [name] has past medical history of bilateral wrist fracture, femur fracture Also  has a past surgical history that includes Femur fracture surgery; Knee surgery; Femur fracture surgery; ORIF wrist fracture (Right, 05/18/2017); ORIF wrist fracture (Right, 05/18/2017); Intramedullary (im) nail intertrochanteric (Left, 05/18/2017); and Hardware Removal (05/18/2017).  Objective:   Vitals: BP 114/70   Pulse 83   Temp 98.5 F (36.9 C) (Oral)   Resp 16   Ht 5\' 9"  (1.753 m) Comment: per pt, can't stand  Wt 120 lb (54.4 kg) Comment: per pt, can't stand  SpO2 100%   BMI 17.72 kg/m   Physical Exam  Constitutional: He is oriented to person, place, and time. He appears well-developed and well-nourished.  Cardiovascular: Normal rate.   Pulmonary/Chest: Effort normal.  Abdominal: Hernia confirmed negative in the right inguinal area and confirmed negative in the left inguinal area.  Genitourinary: Right testis shows no mass, no swelling and no tenderness. Right testis is descended. Cremasteric reflex is not absent on the right side. Left testis shows mass (undifferentiated over area depicted). Left testis shows no swelling and no tenderness. Left testis is descended. Cremasteric reflex is not absent  on the left side. Circumcised. No phimosis, paraphimosis, hypospadias, penile erythema or penile tenderness. No discharge found.     Lymphadenopathy: No inguinal adenopathy noted on the right or left side.  Neurological: He is alert and oriented to person, place, and time.   Results for orders placed or performed in visit on 06/19/17 (from the past 24 hour(s))  POCT urinalysis dipstick     Status: None   Collection Time: 06/19/17  3:08 PM  Result Value Ref Range   Color, UA yellow yellow   Clarity, UA clear clear   Glucose, UA negative negative mg/dL   Bilirubin, UA negative negative   Ketones, POC UA negative negative mg/dL   Spec Grav, UA 4.6961.015 2.9521.010 - 1.025   Blood, UA negative negative   pH, UA 8.0 5.0 - 8.0   Protein Ur, POC negative negative mg/dL   Urobilinogen, UA 0.2 0.2 or 1.0 E.U./dL   Nitrite, UA Negative Negative   Leukocytes, UA Negative Negative    Assessment and Plan :   1. Testicular pain 2. Penile erection impairment 3. Mass of left testicle - Will pursue an US of testicles. Labs pending. Return-to-clinic precautions discussed, patient verbalized understanding.   Wallis BambergMario Toini Failla, PA-C [facility] at [facility] [phone] 06/19/2017  2:30 PM

## 2017-06-19 NOTE — Patient Instructions (Addendum)
Please call us if you have not heard about scheduling an ultrasound in the next 2 weeks. I will place a referral to urology in the meantime as it can take a while to be seen by them. If you get fever, swelling, worsening pain, please return to our clinic for a recheck immediately.    IF you received an x-ray today, you will receive an invoice from [facility]. Please contact [facility] at [phone] with questions or concerns regarding your invoice.   IF you received labwork today, you will receive an invoice from [facility]. Please contact [facility] at [phone] with questions or concerns regarding your invoice.   Our billing staff will not be able to assist you with questions regarding bills from these companies.  You will be contacted with the lab results as soon as they are available. The fastest way to get your results is to activate your My Chart account. Instructions are located on the last page of this paperwork. If you have not heard from us regarding the results in 2 weeks, please contact this office.

## 2017-06-20 DIAGNOSIS — M25631 Stiffness of right wrist, not elsewhere classified: Secondary | ICD-10-CM | POA: Diagnosis not present

## 2017-06-20 DIAGNOSIS — S52572D Other intraarticular fracture of lower end of left radius, subsequent encounter for closed fracture with routine healing: Secondary | ICD-10-CM | POA: Diagnosis not present

## 2017-06-20 LAB — URINALYSIS, MICROSCOPIC ONLY
Casts: NONE SEEN /lpf
Epithelial Cells (non renal): NONE SEEN /hpf (ref 0–10)

## 2017-06-20 LAB — GC/CHLAMYDIA PROBE AMP
Chlamydia trachomatis, NAA: NEGATIVE
Neisseria gonorrhoeae by PCR: NEGATIVE

## 2017-06-20 LAB — HIV ANTIBODY (ROUTINE TESTING W REFLEX): HIV Screen 4th Generation wRfx: NONREACTIVE

## 2017-06-20 LAB — RPR: RPR: NONREACTIVE

## 2017-06-21 LAB — TRICHOMONAS VAGINALIS, PROBE AMP: Trich vag by NAA: NEGATIVE

## 2017-06-22 DIAGNOSIS — S7292XA Unspecified fracture of left femur, initial encounter for closed fracture: Secondary | ICD-10-CM | POA: Diagnosis not present

## 2017-06-24 DIAGNOSIS — M25631 Stiffness of right wrist, not elsewhere classified: Secondary | ICD-10-CM | POA: Diagnosis not present

## 2017-06-25 DIAGNOSIS — M25631 Stiffness of right wrist, not elsewhere classified: Secondary | ICD-10-CM | POA: Diagnosis not present

## 2017-06-25 DIAGNOSIS — S72322D Displaced transverse fracture of shaft of left femur, subsequent encounter for closed fracture with routine healing: Secondary | ICD-10-CM | POA: Diagnosis not present

## 2017-06-25 DIAGNOSIS — T84195D Other mechanical complication of internal fixation device of left femur, subsequent encounter: Secondary | ICD-10-CM | POA: Diagnosis not present

## 2017-06-30 ENCOUNTER — Emergency Department (HOSPITAL_COMMUNITY): Payer: 59

## 2017-06-30 ENCOUNTER — Encounter (HOSPITAL_COMMUNITY): Payer: Self-pay | Admitting: Emergency Medicine

## 2017-06-30 ENCOUNTER — Encounter: Payer: Self-pay | Admitting: Family Medicine

## 2017-06-30 ENCOUNTER — Telehealth: Payer: Self-pay | Admitting: Neurology

## 2017-06-30 ENCOUNTER — Ambulatory Visit (INDEPENDENT_AMBULATORY_CARE_PROVIDER_SITE_OTHER): Payer: 59 | Admitting: Family Medicine

## 2017-06-30 VITALS — BP 122/76 | HR 88 | Temp 98.5°F | Resp 17 | Ht 69.0 in | Wt 123.8 lb

## 2017-06-30 DIAGNOSIS — N50819 Testicular pain, unspecified: Secondary | ICD-10-CM | POA: Insufficient documentation

## 2017-06-30 DIAGNOSIS — Z5321 Procedure and treatment not carried out due to patient leaving prior to being seen by health care provider: Secondary | ICD-10-CM | POA: Diagnosis not present

## 2017-06-30 DIAGNOSIS — R1031 Right lower quadrant pain: Secondary | ICD-10-CM | POA: Diagnosis not present

## 2017-06-30 DIAGNOSIS — R1032 Left lower quadrant pain: Secondary | ICD-10-CM | POA: Diagnosis not present

## 2017-06-30 MED ORDER — OXYCODONE-ACETAMINOPHEN 5-325 MG PO TABS
ORAL_TABLET | ORAL | Status: AC
  Filled 2017-06-30: qty 1

## 2017-06-30 MED ORDER — OXYCODONE-ACETAMINOPHEN 5-325 MG PO TABS
1.0000 | ORAL_TABLET | ORAL | Status: DC | PRN
  Administered 2017-06-30: 1 via ORAL

## 2017-06-30 NOTE — Patient Instructions (Addendum)
Please go to the ER for evaluation and rule out of orchitis, epididymitis or testicular torsion Because the way you fell might have a torsion of the testicle.  Please go to the ER for evaluation and for a doppler scan to ensure that there is no torsion    IF you received an x-ray today, you will receive an invoice from [facility]. Please contact [facility] at [phone] with questions or concerns regarding your invoice.   IF you received labwork today, you will receive an invoice from [facility]. Please contact [facility] at [phone] with questions or concerns regarding your invoice.   Our billing staff will not be able to assist you with questions regarding bills from these companies.  You will be contacted with the lab results as soon as they are available. The fastest way to get your results is to activate your My Chart account. Instructions are located on the last page of this paperwork. If you have not heard from [place] regarding the results in 2 weeks, please contact this office.

## 2017-06-30 NOTE — Progress Notes (Signed)
Chief Complaint  Patient presents with  . Follow-up    testicle pain, fell from balcony x 5-6 weeks ago, believes this is where the testicle pain is coming from.  Pain has continued, pain level 7/10, taking aleve for pain without any relief    HPI  Pt with testicular pain after falling from the 3rd floor balcony. This was not a suicide attempt. He was being chased.  He states that he straddled the balcony then jumped off. When he regained consciousness he had hit the pavement. He states that he has been having pain since his initial injury 05/18/17 He states that his pain is like pins and are shooting. He also has difficulty with erection which is new since his injury. He states that he gets sharp pains and the pain is now 7/10 when it was previously mild.  He has changed his underwear and is wearing more supportive briefs.  He  Has not been able to have sex due to pain and difficulty with erection.  He denies n/v/fever/chills  He denies abdominal pain  No past medical history on file.  Current Outpatient Prescriptions  Medication Sig Dispense Refill  . ibuprofen (ADVIL,MOTRIN) 200 MG tablet Take 800 mg by mouth every 6 (six) hours as needed (pain).    Marland Kitchen oxyCODONE (OXY IR/ROXICODONE) 5 MG immediate release tablet Take 1-2 tablets (5-10 mg total) by mouth every 4 (four) hours as needed for breakthrough pain (  for mild pain,  for moderate pain,  for severe pain). (Patient not taking: Reported on 06/30/2017) 30 tablet 0   No current facility-administered medications for this visit.     Allergies: No Known Allergies  Past Surgical History:  Procedure Laterality Date  . FEMUR FRACTURE SURGERY    . FEMUR FRACTURE SURGERY     ORIF left proximal femur in 3rd grade   . HARDWARE REMOVAL  05/18/2017   Procedure: HARDWARE REMOVAL;  Surgeon: Myrene Galas, MD;  Location: [facility] OR;  Service: Orthopedics;;  . INTRAMEDULLARY (IM) NAIL INTERTROCHANTERIC Left 05/18/2017   Procedure:  INTRAMEDULLARY (IM) NAIL INTERTROCHANTRIC;  Surgeon: Myrene Galas, MD;  Location: [facility] OR;  Service: Orthopedics;  Laterality: Left;  . KNEE SURGERY     reduction of knee dislocation   . ORIF WRIST FRACTURE Right 05/18/2017  . ORIF WRIST FRACTURE Right 05/18/2017   Procedure: OPEN REDUCTION INTERNAL FIXATION (ORIF) WRIST FRACTURE;  Surgeon: Bradly Bienenstock, MD;  Location: [facility] OR;  Service: Orthopedics;  Laterality: Right;    Social History   Social History  . Marital status: Single    Spouse name: N/A  . Number of children: N/A  . Years of education: N/A   Social History Main Topics  . Smoking status: Current Some Day Smoker    Years: 3.00    Types: Cigarettes  . Smokeless tobacco: Never Used  . Alcohol use Yes     Comment: socially  . Drug use: Yes  . Sexual activity: Not Asked   Other Topics Concern  . None   Social History Narrative   ** Merged History Encounter **        ROS Review of Systems See HPI Constitution: No fevers or chills No malaise No diaphoresis Skin: No rash or itching Eyes: no blurry vision, no double vision GU: no dysuria or hematuria Neuro: no dizziness or headaches  Objective: Vitals:   06/30/17 1723  BP: 122/76  Pulse: 88  Resp: 17  Temp: 98.5 F (36.9 C)  TempSrc: Oral  SpO2: 98%  Weight: 123 lb 12.8 oz (56.2 kg)  Height:  (1.753 m)    Physical Exam  Pulmonary/Chest: Effort normal.  Abdominal: Soft. Bowel sounds are normal. He exhibits no distension. There is no tenderness. Hernia confirmed negative in the right inguinal area and confirmed negative in the left inguinal area.  Genitourinary: Penis normal. Cremasteric reflex is present. Right testis shows no mass, no swelling and no tenderness. Right testis is descended. Cremasteric reflex is not absent on the right side. Left testis shows tenderness. Left testis shows no mass and no swelling. Left testis is descended. Cremasteric reflex is not absent on the left side. Circumcised.  No phimosis, paraphimosis, hypospadias, penile erythema or penile tenderness. No discharge found.     Lymphadenopathy: No inguinal adenopathy noted on the right or left side.   Patient was very tender with palpation of the inguinal canal Also with palpation of the base of the penis Also with palpation of the scrutum and perineum his pain localized to the left inguinal area  Assessment and Plan Brave was seen today for follow-up.  Diagnoses and all orders for this visit:  Testicular pain   Medical Decision Making Since the mechanism of his testicular injury is unknown and the patient is currently having progressive pain in the testicle that is worse on the left side and seems to be present in the inguinal canal and referred to the base of the penis will refer to the ER My concern is that he might have had a partial torsion Also cannot rule out straddle injury  Also cannot rule out infectious process   A total of 30 minutes were spent face-to-face with the patient during this encounter and over half of that time was spent on counseling and coordination of care.   Linn Clavin A Ivin Rosenbloom

## 2017-06-30 NOTE — ED Triage Notes (Signed)
Pt states he fell of balcony 8/5 approx 45 ft, fx both wrists, fx L femur. Pt has had testicular pain ever since the fall as well. He has had a hard time getting an erection, has not been able to have sex since the accident. Sent by [facility] for r/o torsion vs straddle injury vs infectious process.

## 2017-07-01 ENCOUNTER — Encounter (HOSPITAL_COMMUNITY): Payer: Self-pay

## 2017-07-01 ENCOUNTER — Emergency Department (HOSPITAL_COMMUNITY)
Admission: EM | Admit: 2017-07-01 | Discharge: 2017-07-01 | Disposition: A | Discharge status: Home / Self Care | Payer: 59 | Source: Home / Self Care | Attending: Emergency Medicine | Admitting: Emergency Medicine

## 2017-07-01 ENCOUNTER — Emergency Department (HOSPITAL_COMMUNITY)
Admission: EM | Admit: 2017-07-01 | Discharge: 2017-07-01 | Disposition: A | Discharge status: Home / Self Care | Payer: 59 | Attending: Emergency Medicine | Admitting: Emergency Medicine

## 2017-07-01 DIAGNOSIS — R1030 Lower abdominal pain, unspecified: Secondary | ICD-10-CM | POA: Insufficient documentation

## 2017-07-01 DIAGNOSIS — F17210 Nicotine dependence, cigarettes, uncomplicated: Secondary | ICD-10-CM

## 2017-07-01 DIAGNOSIS — R1031 Right lower quadrant pain: Secondary | ICD-10-CM | POA: Diagnosis not present

## 2017-07-01 DIAGNOSIS — R52 Pain, unspecified: Secondary | ICD-10-CM

## 2017-07-01 DIAGNOSIS — N529 Male erectile dysfunction, unspecified: Secondary | ICD-10-CM | POA: Insufficient documentation

## 2017-07-01 DIAGNOSIS — N4889 Other specified disorders of penis: Secondary | ICD-10-CM | POA: Insufficient documentation

## 2017-07-01 DIAGNOSIS — R1032 Left lower quadrant pain: Secondary | ICD-10-CM | POA: Diagnosis not present

## 2017-07-01 DIAGNOSIS — R3915 Urgency of urination: Secondary | ICD-10-CM

## 2017-07-01 LAB — URINALYSIS, ROUTINE W REFLEX MICROSCOPIC
Bilirubin Urine: NEGATIVE
Glucose, UA: NEGATIVE mg/dL
Hgb urine dipstick: NEGATIVE
Ketones, ur: NEGATIVE mg/dL
Leukocytes, UA: NEGATIVE
Nitrite: NEGATIVE
Protein, ur: NEGATIVE mg/dL
Specific Gravity, Urine: 1.02 (ref 1.005–1.030)
pH: 6 (ref 5.0–8.0)

## 2017-07-01 NOTE — ED Notes (Signed)
ED Provider at bedside. 

## 2017-07-01 NOTE — ED Triage Notes (Signed)
Pt. Left yesterday prior to having his results given to him by MD.  When he was ready to leave, Nurse First told him that she spoke with the Doctor and they wanted to have additional tests .  He could not stay and he is back for those tests. The pain has continued.  No change

## 2017-07-01 NOTE — ED Provider Notes (Signed)
[facility]-EMERGENCY DEPT Provider Note   CSN: [id number] Arrival date & time: 07/01/17  1147     History   Chief Complaint Chief Complaint  Patient presents with  . Groin Pain    HPI Gerald LINHARES is a 21 y.o. male w PMHx of recent fall from 3rd story balcony on 05/18/17, presenting w intermittent scrotal tingling and perineal pain that began about 2 weeks ago. Pt states he began having sensation of pain or tingling down b/l lateral aspect of scrotum and perineum (posterior to his scrotum). No medications tried. Pt reports assoc trouble with erections since injury as well. He has some pain at the base of his penis as well. He reports urinary urgency, but denies bowel or bladder incontinence. Denies back pain, N/T down extremities or in other parts of groin, rectal pain, abdominal pain, hip pain, fever or other complaints. Reports left femur fracture is healing with some intermittent pain and residual weakness. No new injuries.  He reports being seen twice by urgent care, however since he has had persistent pain they recommended he reports to ED for an ultrasound. Pt checked in to ED yesterday and received scrotal ultrasound however left before being seen and knowing results of imaging.  Per chart review from ED/admission after fall showing: xray of pelvis without pelvic fracture and no spinal injuries. Pt fractured left proximal femur and b/l wrists. The history is provided by the patient.    History reviewed. No pertinent past medical history.  Patient Active Problem List   Diagnosis Date Noted  . Femur fracture, left ([facility]) 05/18/2017    Past Surgical History:  Procedure Laterality Date  . FEMUR FRACTURE SURGERY    . FEMUR FRACTURE SURGERY     ORIF left proximal femur in 3rd grade   . HARDWARE REMOVAL  05/18/2017   Procedure: HARDWARE REMOVAL;  Surgeon: Myrene Galas, MD;  Location: [facility] OR;  Service: Orthopedics;;  . INTRAMEDULLARY (IM) NAIL INTERTROCHANTERIC Left 05/18/2017   Procedure: INTRAMEDULLARY (IM) NAIL INTERTROCHANTRIC;  Surgeon: Myrene Galas, MD;  Location: [facility] OR;  Service: Orthopedics;  Laterality: Left;  . KNEE SURGERY     reduction of knee dislocation   . ORIF WRIST FRACTURE Right 05/18/2017  . ORIF WRIST FRACTURE Right 05/18/2017   Procedure: OPEN REDUCTION INTERNAL FIXATION (ORIF) WRIST FRACTURE;  Surgeon: Bradly Bienenstock, MD;  Location: [facility] OR;  Service: Orthopedics;  Laterality: Right;       Home Medications    Prior to Admission medications   Medication Sig Start Date End Date Taking? Authorizing Provider  ibuprofen (ADVIL,MOTRIN) 200 MG tablet Take 800 mg by mouth every 6 (six) hours as needed (pain).    [provider]  oxyCODONE (OXY IR/ROXICODONE) 5 MG immediate release tablet Take 1-2 tablets (5-10 mg total) by mouth every 4 (four) hours as needed for breakthrough pain (  for mild pain,  for moderate pain,  for severe pain). Patient not taking: Reported on 06/30/2017 05/21/17   Rayburn, Alphonsus Sias, PA-C    Family History No family history on file.  Social History Social History  Substance Use Topics  . Smoking status: Current Some Day Smoker    Years: 3.00    Types: Cigarettes  . Smokeless tobacco: Never Used  . Alcohol use Yes     Comment: socially     Allergies   Patient has no known allergies.   Review of Systems Review of Systems  Constitutional: Negative for fever.  Gastrointestinal: Negative for abdominal pain and nausea.  Genitourinary: Positive for penile pain and urgency. Negative for dysuria, frequency, penile swelling, scrotal swelling and testicular pain.       Scrotal and perineal pain  Musculoskeletal: Negative for back pain.  Neurological: Negative for numbness.  All other systems reviewed and are negative.    Physical Exam Updated Vital Signs BP 113/77 (BP Location: Right Arm)   Pulse 72   Temp 98.6 F (37 C) (Oral)   Resp 16   Ht  (1.753 m)   Wt 56.7 kg (125 lb)   SpO2 100%    BMI 18.46 kg/m   Physical Exam  Constitutional: He appears well-developed and well-nourished. No distress.  HENT:  Head: Normocephalic and atraumatic.  Eyes: Conjunctivae are normal.  Cardiovascular: Normal rate, regular rhythm, normal heart sounds and intact distal pulses.   Pulmonary/Chest: Effort normal and breath sounds normal.  Abdominal: Soft. Bowel sounds are normal. He exhibits no distension and no mass. There is no tenderness. There is no rebound and no guarding. No hernia.  Genitourinary:  Genitourinary Comments: Exam performed with chaperone present. Normal rectal tone without tenderness. Mild tenderness along the lateral aspects of scrotum. Scrotum without erythema or edema No testicular tenderness or edema. Base of penis with some tenderness, no discharge. Groin with normal sensation.  Musculoskeletal:  Surgical scar left lateral hip and thigh, healing well. Pelvis is stable. No spinal or paraspinal tenderness. No bony step-offs or gross deformities.  Neurological: He is alert. He displays normal reflexes. He exhibits normal muscle tone.  Left lower extremity slightly weaker than right with hip flexion, however equal knee extension bilaterally. Suspect weakness with L hip flexion secondary to recent femur ORIF. Normal sensation bilateral lower extremities.   Skin: Skin is warm.  Psychiatric: He has a normal mood and affect. His behavior is normal.  Nursing note and vitals reviewed.    ED Treatments / Results  Labs (all labs ordered are listed, but only abnormal results are displayed) Labs Reviewed  URINALYSIS, ROUTINE W REFLEX MICROSCOPIC - Abnormal; Notable for the following:       Result Value   Color, Urine AMBER (*)    APPearance HAZY (*)    All other components within normal limits    EKG  EKG Interpretation None       Radiology US Scrotum  Result Date: 07/01/2017 CLINICAL DATA:  Bilateral testicular pain after a fall several weeks ago. Pain for 3  weeks. EXAM: SCROTAL ULTRASOUND DOPPLER ULTRASOUND OF THE TESTICLES TECHNIQUE: Complete ultrasound examination of the testicles, epididymis, and other scrotal structures was performed. Color and spectral Doppler ultrasound were also utilized to evaluate blood flow to the testicles. COMPARISON:  None. FINDINGS: Right testicle Measurements: 4.4 x 1.9 x 2.8 cm. No mass or microlithiasis visualized. Left testicle Measurements: 4.2 x 2.1 x 2.8 cm. No mass or microlithiasis visualized. Right epididymis:  Normal in size and appearance. Left epididymis: Epididymal cyst or spermatocele measuring about 1.2 cm maximal diameter. Hydrocele:  A small right hydrocele is present. Varicocele:  None visualized. Pulsed Doppler interrogation of both testes demonstrates normal low resistance arterial and venous waveforms bilaterally. Pain is most prominent in the perineum at the base of the penis. Imaging of this area demonstrates no gross abnormality. IMPRESSION: Normal sound appearance of the testicles. No evidence of testicular mass or torsion. Small epididymal cyst on the left. Small right hydrocele. Electronically Signed   By: Burman Nieves M.D.   On: 07/01/2017 00:09   [place] Scrotom Doppler  Result Date:  07/01/2017 CLINICAL DATA:  Bilateral testicular pain after a fall several weeks ago. Pain for 3 weeks. EXAM: SCROTAL ULTRASOUND DOPPLER ULTRASOUND OF THE TESTICLES TECHNIQUE: Complete ultrasound examination of the testicles, epididymis, and other scrotal structures was performed. Color and spectral Doppler ultrasound were also utilized to evaluate blood flow to the testicles. COMPARISON:  None. FINDINGS: Right testicle Measurements: 4.4 x 1.9 x 2.8 cm. No mass or microlithiasis visualized. Left testicle Measurements: 4.2 x 2.1 x 2.8 cm. No mass or microlithiasis visualized. Right epididymis:  Normal in size and appearance. Left epididymis: Epididymal cyst or spermatocele measuring about 1.2 cm maximal diameter. Hydrocele:  A  small right hydrocele is present. Varicocele:  None visualized. Pulsed Doppler interrogation of both testes demonstrates normal low resistance arterial and venous waveforms bilaterally. Pain is most prominent in the perineum at the base of the penis. Imaging of this area demonstrates no gross abnormality. IMPRESSION: Normal sound appearance of the testicles. No evidence of testicular mass or torsion. Small epididymal cyst on the left. Small right hydrocele. Electronically Signed   By: Burman Nieves M.D.   On: 07/01/2017 00:09    Procedures Procedures (including critical care time)  Medications Ordered in ED Medications - No data to display   Initial Impression / Assessment and Plan / ED Course  I have reviewed the triage vital signs and the nursing notes.  Pertinent labs & imaging results that were available during my care of the patient were reviewed by me and considered in my medical decision making (see chart for details).     Presenting with scrotal and perineal tingling/pain, as well as erectile dysfunction. Recent injuries last month did not include pelvic fracture or spinal injuries. Patient is without back pain, with normal neurologic exam. No abdominal pain or tenderness. No urinary symptoms, with unremarkable UA. Rectal exam with normal rectal tone and no tenderness. Ultrasound of scrotum with Doppler negative for testicular torsion, showing small right testicular hydrocele and small left epididymal cyst. Referral given for urology for follow-up of patient's symptoms. Patient is afebrile, non-distress, safe for discharge home. Strict return precautions discussed.   Patient discussed with Dr. Adela Lank, who guided treatment and agrees with care plan for discharge.  Discussed results, findings, treatment and follow up. Patient advised of return precautions. Patient verbalized understanding and agreed with plan.  Final Clinical Impressions(s) / ED Diagnoses   Final diagnoses:    Bilateral groin pain    New Prescriptions Discharge Medication List as of 07/01/2017  3:05 PM       Russo, [place] N, PA-C 07/01/17 2033    Melene Plan, DO 07/04/17 1840

## 2017-07-01 NOTE — Discharge Instructions (Signed)
Please read instructions below. You can take Advil or Tylenol as needed for pain. Schedule an appointment with the urologist for follow-up on your complaints today. Return to the ER for new or concerning symptoms.

## 2017-07-01 NOTE — ED Notes (Signed)
Pt and family stated they could not wait anymore tonight. Told pt after speaking with our MD that even though his ultrasound results have come back the MD still wants him to have a physical exam. Pt stated they can not wait any longer and he would follow up with his PCP or come back in the morning.

## 2017-07-02 DIAGNOSIS — M25631 Stiffness of right wrist, not elsewhere classified: Secondary | ICD-10-CM | POA: Diagnosis not present

## 2017-07-04 DIAGNOSIS — M25631 Stiffness of right wrist, not elsewhere classified: Secondary | ICD-10-CM | POA: Diagnosis not present

## 2017-07-04 DIAGNOSIS — S52572D Other intraarticular fracture of lower end of left radius, subsequent encounter for closed fracture with routine healing: Secondary | ICD-10-CM | POA: Diagnosis not present

## 2017-07-08 DIAGNOSIS — M25631 Stiffness of right wrist, not elsewhere classified: Secondary | ICD-10-CM | POA: Diagnosis not present

## 2017-07-15 ENCOUNTER — Telehealth: Payer: Self-pay | Admitting: Urgent Care

## 2017-07-15 DIAGNOSIS — N5089 Other specified disorders of the male genital organs: Secondary | ICD-10-CM

## 2017-07-15 DIAGNOSIS — M25631 Stiffness of right wrist, not elsewhere classified: Secondary | ICD-10-CM | POA: Diagnosis not present

## 2017-07-15 NOTE — Telephone Encounter (Signed)
Sarah from [facility] called requesting we add a U/S Scrotum to the U/S Scrotum Doppler order. Please advise. Thanks! [facility] can be reached at [phone] and press option [phone].

## 2017-07-18 ENCOUNTER — Ambulatory Visit
Admission: RE | Admit: 2017-07-18 | Discharge: 2017-07-18 | Disposition: A | Discharge status: Home / Self Care | Payer: 59 | Source: Ambulatory Visit | Attending: Urgent Care | Admitting: Urgent Care

## 2017-07-18 DIAGNOSIS — N529 Male erectile dysfunction, unspecified: Secondary | ICD-10-CM

## 2017-07-18 DIAGNOSIS — N50819 Testicular pain, unspecified: Secondary | ICD-10-CM

## 2017-07-18 NOTE — Telephone Encounter (Signed)
Order has been added. Thank you!

## 2017-07-21 NOTE — Telephone Encounter (Signed)
Thank you! Spoke with Winfred Leeds at [facility] to let her know order has been added. She did notice pt had a U/S Scrotum and U/S Scrotum w/ doppler on 06/30/17 ordered by another doctor. She wanted to make sure we still wanted to proceed with this order. Winfred Leeds can be reached at [phone]. Please advise. Thanks!

## 2017-07-22 DIAGNOSIS — S7292XA Unspecified fracture of left femur, initial encounter for closed fracture: Secondary | ICD-10-CM | POA: Diagnosis not present

## 2017-07-22 DIAGNOSIS — M25631 Stiffness of right wrist, not elsewhere classified: Secondary | ICD-10-CM | POA: Diagnosis not present

## 2017-07-25 DIAGNOSIS — M25631 Stiffness of right wrist, not elsewhere classified: Secondary | ICD-10-CM | POA: Diagnosis not present

## 2017-07-29 DIAGNOSIS — M25631 Stiffness of right wrist, not elsewhere classified: Secondary | ICD-10-CM | POA: Diagnosis not present

## 2017-08-01 DIAGNOSIS — S52572D Other intraarticular fracture of lower end of left radius, subsequent encounter for closed fracture with routine healing: Secondary | ICD-10-CM | POA: Diagnosis not present

## 2017-08-01 DIAGNOSIS — S52531F Colles' fracture of right radius, subsequent encounter for open fracture type IIIA, IIIB, or IIIC with routine healing: Secondary | ICD-10-CM | POA: Diagnosis not present

## 2017-08-01 DIAGNOSIS — M25631 Stiffness of right wrist, not elsewhere classified: Secondary | ICD-10-CM | POA: Diagnosis not present

## 2017-08-12 DIAGNOSIS — M25631 Stiffness of right wrist, not elsewhere classified: Secondary | ICD-10-CM | POA: Diagnosis not present

## 2017-08-18 DIAGNOSIS — M25631 Stiffness of right wrist, not elsewhere classified: Secondary | ICD-10-CM | POA: Diagnosis not present

## 2017-08-22 DIAGNOSIS — S7292XA Unspecified fracture of left femur, initial encounter for closed fracture: Secondary | ICD-10-CM | POA: Diagnosis not present

## 2017-08-25 DIAGNOSIS — M25631 Stiffness of right wrist, not elsewhere classified: Secondary | ICD-10-CM | POA: Diagnosis not present

## 2017-08-27 DIAGNOSIS — M25631 Stiffness of right wrist, not elsewhere classified: Secondary | ICD-10-CM | POA: Diagnosis not present

## 2017-08-29 DIAGNOSIS — S52531F Colles' fracture of right radius, subsequent encounter for open fracture type IIIA, IIIB, or IIIC with routine healing: Secondary | ICD-10-CM | POA: Diagnosis not present

## 2017-08-29 DIAGNOSIS — S52572D Other intraarticular fracture of lower end of left radius, subsequent encounter for closed fracture with routine healing: Secondary | ICD-10-CM | POA: Diagnosis not present

## 2017-09-21 DIAGNOSIS — S7292XA Unspecified fracture of left femur, initial encounter for closed fracture: Secondary | ICD-10-CM | POA: Diagnosis not present

## 2017-10-14 HISTORY — PX: SHOULDER ARTHROSCOPY WITH ROTATOR CUFF REPAIR: SHX5685

## 2017-10-22 DIAGNOSIS — S7292XA Unspecified fracture of left femur, initial encounter for closed fracture: Secondary | ICD-10-CM | POA: Diagnosis not present

## 2017-11-22 DIAGNOSIS — S7292XA Unspecified fracture of left femur, initial encounter for closed fracture: Secondary | ICD-10-CM | POA: Diagnosis not present

## 2017-12-17 NOTE — Telephone Encounter (Signed)
Error

## 2017-12-26 IMAGING — DX DG CHEST 1V PORT
2 series · 2 of 2 positions shown · non-contrast
Comparison: None.

CLINICAL DATA: Status post fall from third story. Initial
encounter.

EXAM:
PORTABLE CHEST 1 VIEW

[chest ap]
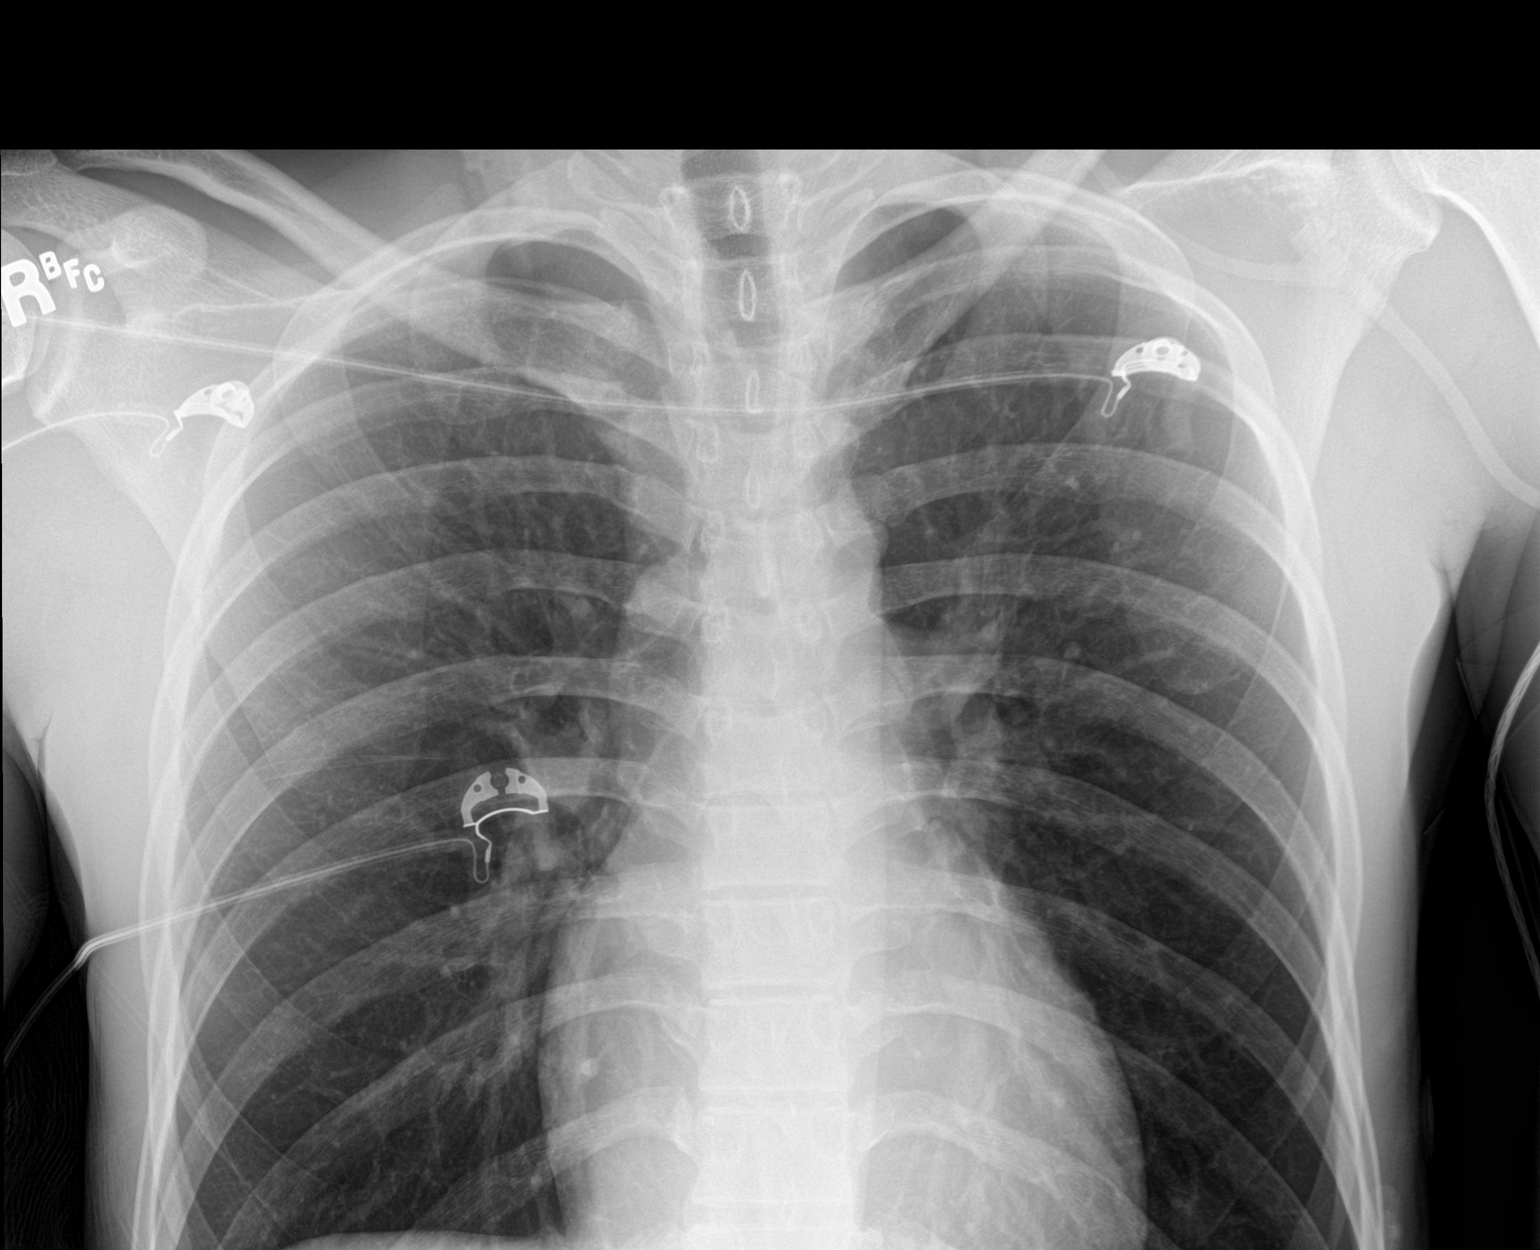

[chest pa]
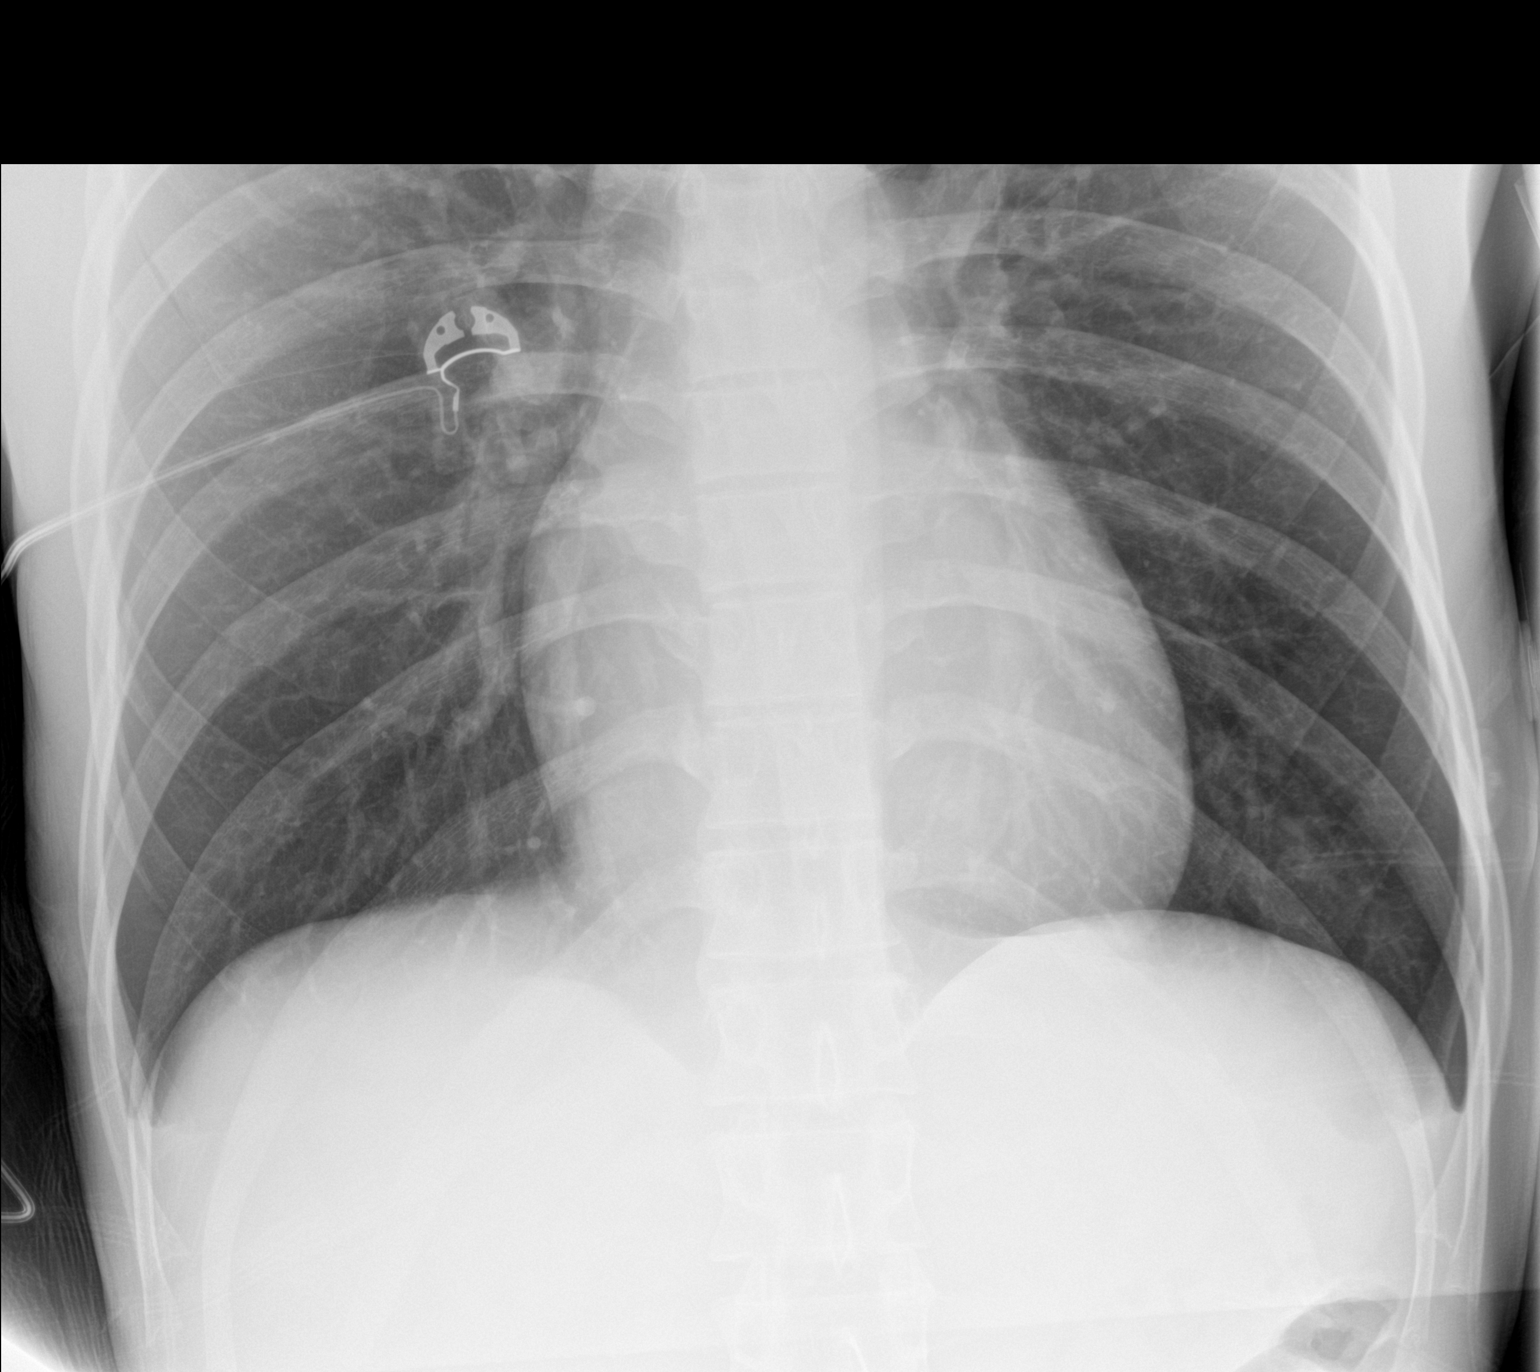

[2 of 2 positions shown; findings below may reference images not displayed]

FINDINGS: The lungs are well-aerated and clear. There is no evidence of focal
opacification, pleural effusion or pneumothorax.

The cardiomediastinal silhouette is within normal limits. No acute
osseous abnormalities are seen.
IMPRESSION: No displaced rib fractures seen. No acute cardiopulmonary process
identified.

## 2017-12-26 IMAGING — CT CT HEAD W/O CM
5 of 10 series · 16 of 47 positions shown, 17 images · non-contrast
Comparison: None.

CLINICAL DATA: Initial evaluation for acute trauma.

EXAM:
CT HEAD WITHOUT CONTRAST
CT CERVICAL SPINE WITHOUT CONTRAST
TECHNIQUE: Multidetector CT imaging of the head and cervical spine was
performed following the standard protocol without intravenous
contrast. Multiplanar CT image reconstructions of the cervical spine
were also generated.

[Series 6: head bone · axial · 0.46mm/px · z∈[-109,-9]mm · 4 of 84 slices shown, 5 images (1 of 2)]
[im 17/84  brain]
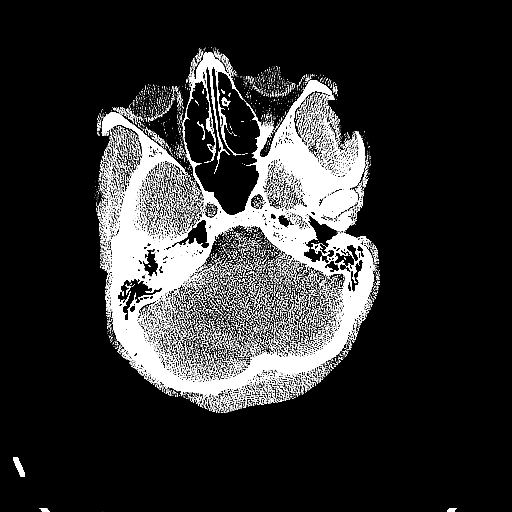
[im 17/84  bone]
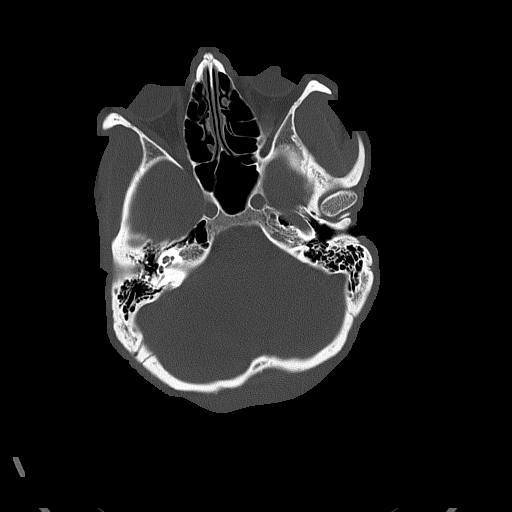
[im 34/84  brain]
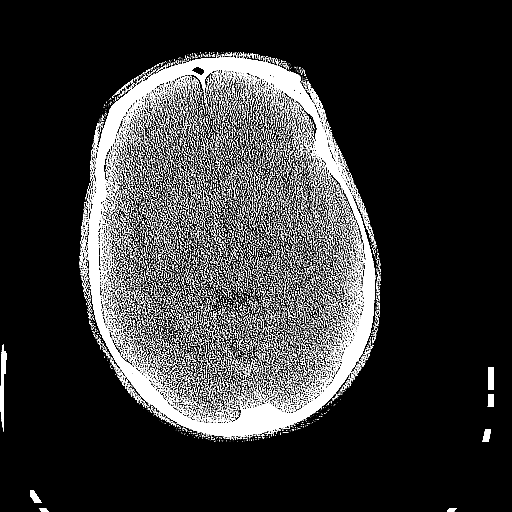
[im 50/84  brain]
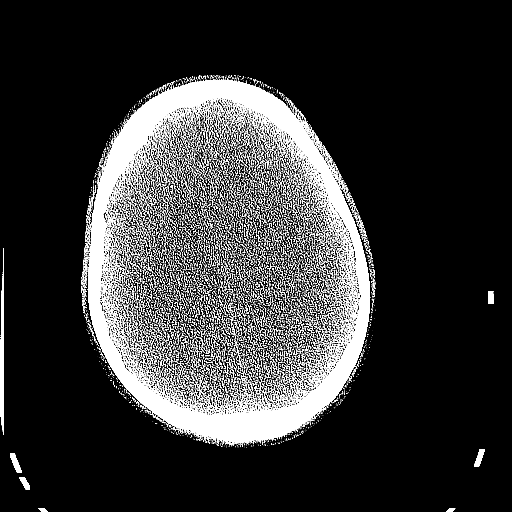
[im 67/84  brain]
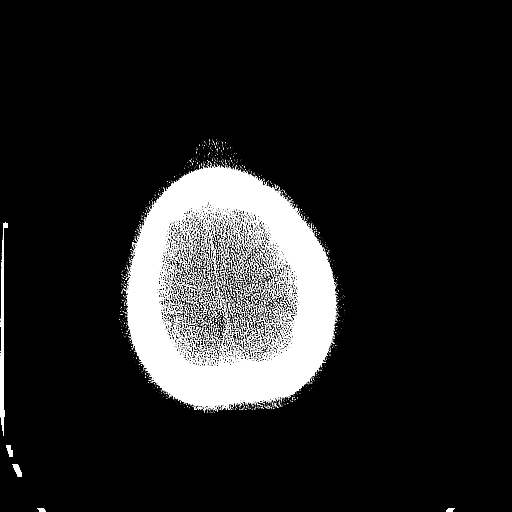

[Series 7: head bone · axial · 0.46mm/px · z∈[-109,-9]mm · 4 of 84 slices shown (2 of 2)]
[im 17/84  bone]
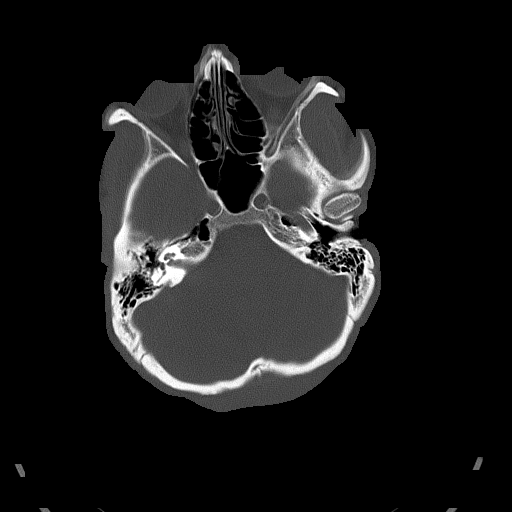
[im 34/84  bone]
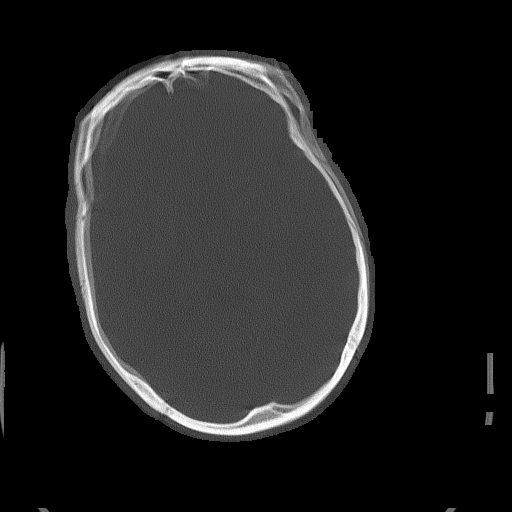
[im 50/84  bone]
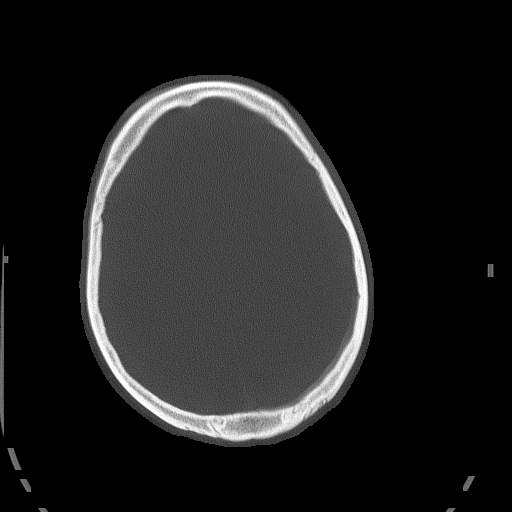
[im 67/84  bone]
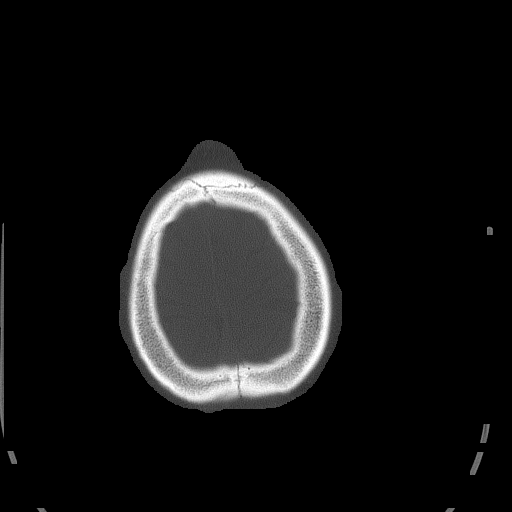

[Series 8: head without cor · coronal · non-contrast · 0.33mm/px · 3 of 67 slices shown]
[im 17/67  brain]
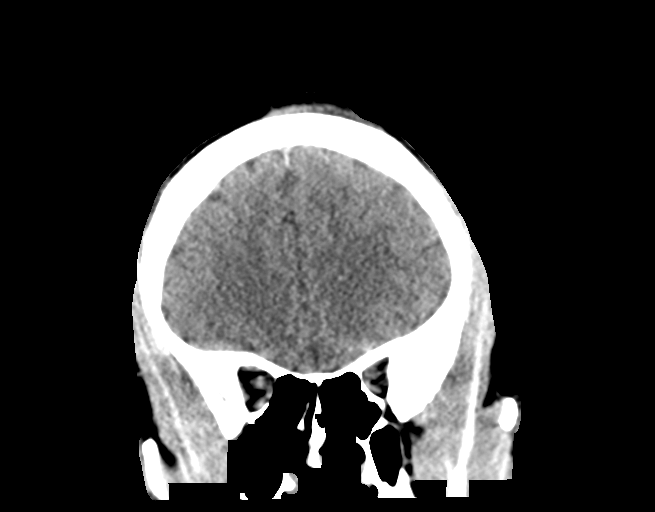
[im 34/67  brain]
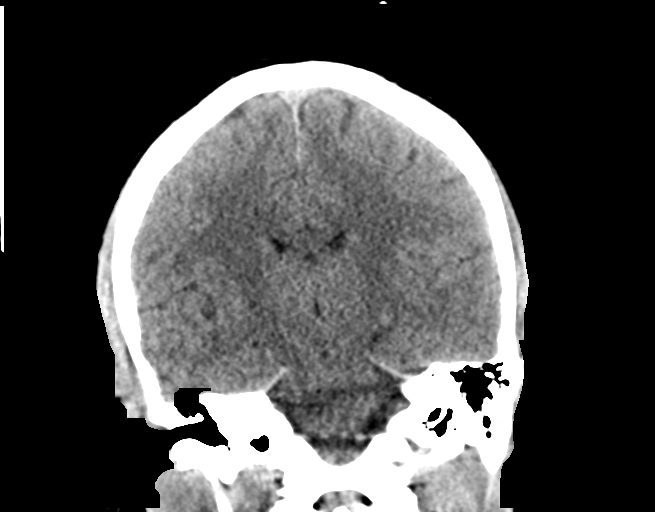
[im 50/67  brain]
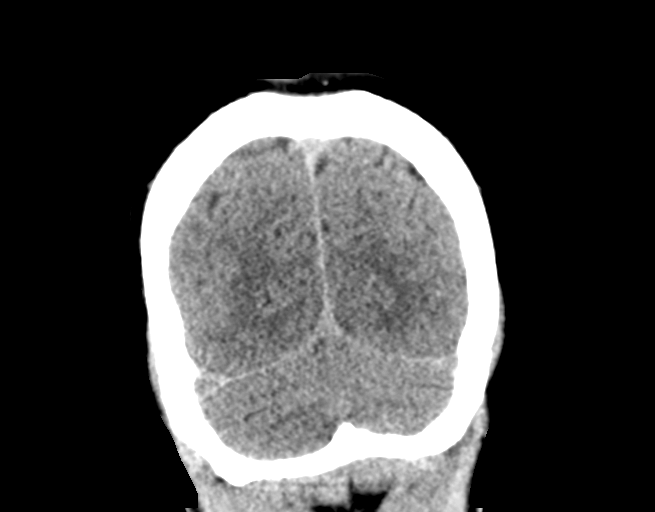

[Series 9: head without sag · sagittal · non-contrast · 0.33mm/px · 2 of 67 slices shown]
[im 23/67  brain]
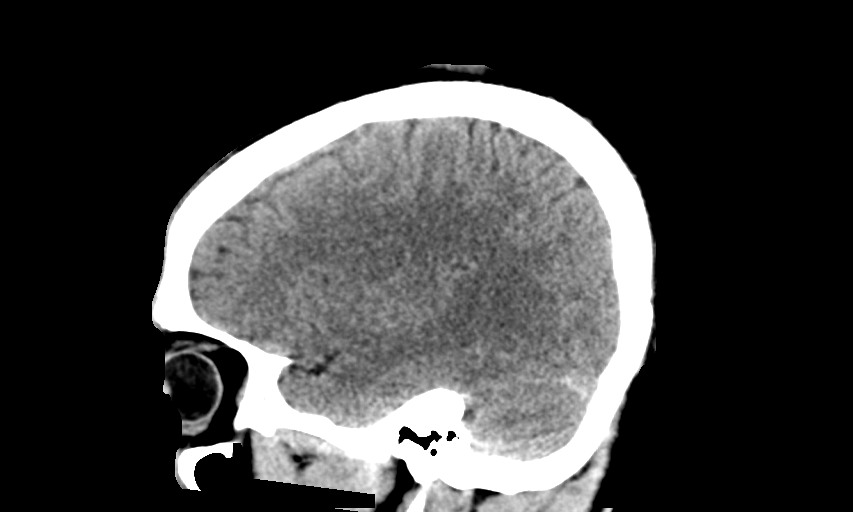
[im 45/67  brain]
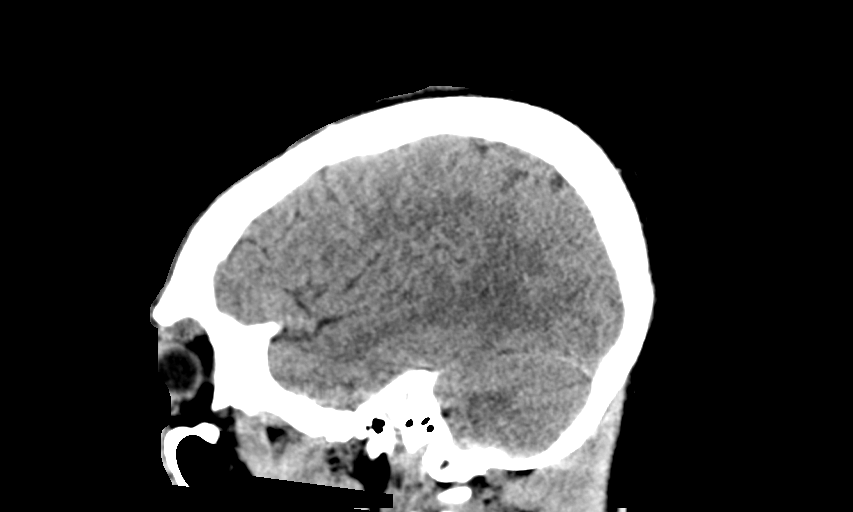

[Series 10: c_spine 2.0 st · axial · 0.36mm/px · z∈[-296,-232]mm · 3 of 114 slices shown]
[im 17/114  brain]
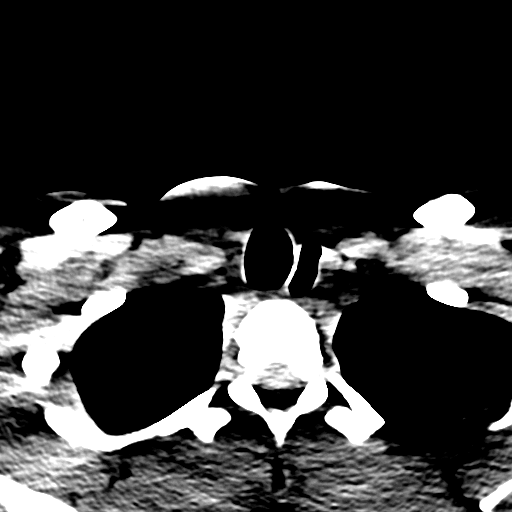
[im 33/114  brain]
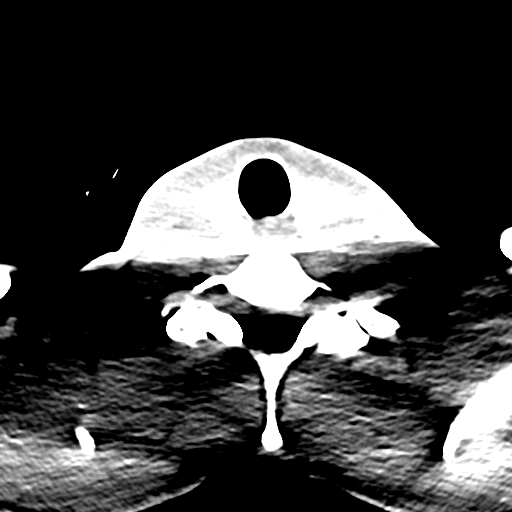
[im 49/114  brain]
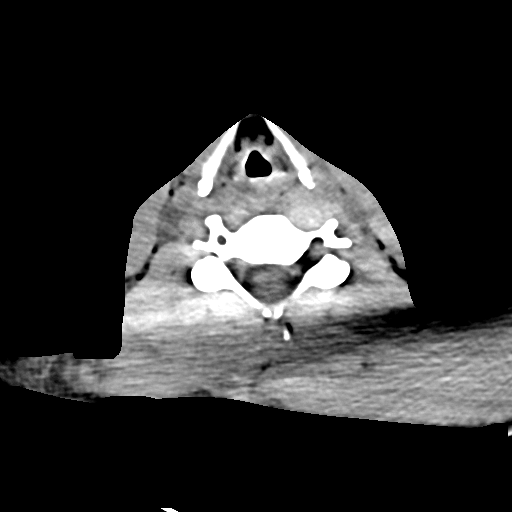

[16 of 47 positions shown; findings below may reference images not displayed]

FINDINGS: CT HEAD FINDINGS

Brain: Study degraded by motion. Cerebral volume normal. No acute
intracranial hemorrhage or infarct. No mass lesion, midline shift or
mass effect. No hydrocephalus. No extra-axial fluid collection.

Vascular: No hyperdense vessel.

Skull: Soft tissue contusions with laceration present at the central
forehead near the vertex an at the left supraorbital region. Scalp
soft tissues otherwise within normal limits. Calvarium intact.

Sinuses/Orbits: Globes and orbital soft tissues normal. Minimal
mucosal thickening within the left sphenoid sinus. Paranasal sinuses
are otherwise clear. No mastoid effusion.

CT CERVICAL SPINE FINDINGS

Alignment: Vertebral bodies normally aligned with preservation of
the normal cervical lordosis. No listhesis.

Skull base and vertebrae: Skullbase intact. Normal C1-2
articulations are preserved in the dens is intact. Vertebral body
heights maintained. No acute fracture.

Soft tissues and spinal canal: Soft tissues of the neck demonstrate
no acute abnormality. No prevertebral edema.

Disc levels: No significant degenerative changes within the cervical
spine.

Upper chest: Visualized upper chest within normal limits. Visualized
lung apices are clear. No apical pneumothorax.
IMPRESSION: CT BRAIN:

1. No acute intracranial process.
2. Multifocal scalp contusions with lacerations about the forehead.
Calvarium intact.

CT CERVICAL SPINE:

No acute traumatic injury within the cervical spine.

## 2018-03-18 ENCOUNTER — Other Ambulatory Visit: Payer: Self-pay

## 2018-03-18 ENCOUNTER — Encounter: Payer: Self-pay | Admitting: Physician Assistant

## 2018-03-18 ENCOUNTER — Ambulatory Visit: Payer: 59 | Admitting: Physician Assistant

## 2018-03-18 VITALS — BP 100/54 | HR 67 | Temp 99.1°F | Ht 70.0 in | Wt 128.4 lb

## 2018-03-18 DIAGNOSIS — M25511 Pain in right shoulder: Secondary | ICD-10-CM

## 2018-03-18 MED ORDER — NAPROXEN 500 MG PO TABS: 500.0000 mg | ORAL_TABLET | Freq: Two times a day (BID) | ORAL | 0 refills | Status: DC

## 2018-03-18 MED ORDER — CYCLOBENZAPRINE HCL 10 MG PO TABS: 5.0000 mg | ORAL_TABLET | Freq: Three times a day (TID) | ORAL | 0 refills | Status: DC | PRN

## 2018-03-18 NOTE — Patient Instructions (Addendum)
Please take the medications as prescribed. Okay to go back to work tomorrow as long as you are taking medication.    IF you received an x-ray today, you will receive an invoice from [facility]. Please contact [facility] at [phone] with questions or concerns regarding your invoice.   IF you received labwork today, you will receive an invoice from [facility]. Please contact [facility] at [phone] with questions or concerns regarding your invoice.   Our billing staff will not be able to assist you with questions regarding bills from these companies.  You will be contacted with the lab results as soon as they are available. The fastest way to get your results is to activate your My Chart account. Instructions are located on the last page of this paperwork. If you have not heard from us regarding the results in 2 weeks, please contact this office.

## 2018-03-18 NOTE — Progress Notes (Signed)
03/18/2018 5:12 PM   DOB: 1996/09/15 / MRN: [id number]  SUBJECTIVE:  Gerald Munoz is a 22 y.o. male presenting for right shoulder pain that started after a fall.  He has not tried any medication yet.  States he was working at [employer] when this happened.  He is notable for easily moving the extremity throughout the exam.  He tells me he needs a note to be out of work for today.  He has No Known Allergies.   He  has no past medical history on file.    He  reports that he has been smoking cigarettes.  He has smoked for the past 3.00 years. He has never used smokeless tobacco. He reports that he drinks alcohol. He reports that he has current or past drug history. He  has no sexual activity history on file. The patient  has a past surgical history that includes Femur fracture surgery; Knee surgery; Femur fracture surgery; ORIF wrist fracture (Right, 05/18/2017); ORIF wrist fracture (Right, 05/18/2017); Intramedullary (im) nail intertrochanteric (Left, 05/18/2017); and Hardware Removal (05/18/2017).  His family history is not on file.  Review of Systems  Musculoskeletal: Positive for falls, joint pain and myalgias. Negative for back pain and neck pain.  Neurological: Negative for dizziness.    The problem list and medications were reviewed and updated by myself where necessary and exist elsewhere in the encounter.   OBJECTIVE:  BP (!) 100/54 (BP Location: Left Arm, Patient Position: Sitting, Cuff Size: Normal)   Pulse 67   Temp 99.1 F (37.3 C) (Oral)   Ht 5\' 10"  (1.778 m)   Wt 128 lb 6.4 oz (58.2 kg)   SpO2 (!) 67%   BMI 18.42 kg/m   Physical Exam  Constitutional: He is oriented to person, place, and time. He appears well-developed. He does not appear ill.  Eyes: Pupils are equal, round, and reactive to light. Conjunctivae and EOM are normal.  Cardiovascular: Normal rate, regular rhythm, S1 normal, S2 normal, normal heart sounds, intact distal pulses and normal pulses. Exam reveals  no gallop and no friction rub.  No murmur heard. Pulmonary/Chest: Effort normal. No stridor. No respiratory distress. He has no wheezes. He has no rales.  Abdominal: He exhibits no distension.  Musculoskeletal: Normal range of motion. He exhibits no edema, tenderness or deformity.       Right shoulder: He exhibits pain. He exhibits normal range of motion, no tenderness, no bony tenderness, no swelling, no effusion, no crepitus, no deformity, no laceration, no spasm, normal pulse and normal strength.       Arms: Neurological: He is alert and oriented to person, place, and time. No cranial nerve deficit. Coordination normal.  Skin: Skin is warm and dry. He is not diaphoretic.  Psychiatric: He has a normal mood and affect.  Nursing note and vitals reviewed.   No results found for this or any previous visit (from the past 72 hour(s)).  No results found.  ASSESSMENT AND PLAN:  Gerald Munoz was seen today for rt shoulder pain.  Diagnoses and all orders for this visit:  Acute pain of right shoulder: I think he most likely has a normal shoulder exam.  He seemed to want to make the case that his shoulder dislocated and that he was able to put it back and in the room.  However most dislocations are anterior and empty can test does not elicit this range of motion.  He has been slightly tender however per his range of motion  he is able to work.  I am getting him back to work tomorrow with naproxen and Flexeril. -     naproxen (NAPROSYN) 500 MG tablet; Take 1 tablet (500 mg total) by mouth 2 (two) times daily with a meal. -     cyclobenzaprine (FLEXERIL) 10 MG tablet; Take 0.5-1 tablets (5-10 mg total) by mouth 3 (three) times daily as needed.    The patient is advised to call or return to clinic if he does not see an improvement in symptoms, or to seek the care of the closest emergency department if he worsens with the above plan.   Deliah Boston, MHS, PA-C Primary Care at [facility] 03/18/2018 5:12 PM

## 2018-03-20 ENCOUNTER — Telehealth: Payer: Self-pay

## 2018-03-20 ENCOUNTER — Ambulatory Visit (HOSPITAL_COMMUNITY)
Admission: EM | Admit: 2018-03-20 | Discharge: 2018-03-20 | Disposition: A | Discharge status: Home / Self Care | Payer: 59 | Attending: Family Medicine | Admitting: Family Medicine

## 2018-03-20 ENCOUNTER — Encounter (HOSPITAL_COMMUNITY): Payer: Self-pay | Admitting: Family Medicine

## 2018-03-20 DIAGNOSIS — S40011D Contusion of right shoulder, subsequent encounter: Secondary | ICD-10-CM | POA: Diagnosis not present

## 2018-03-20 NOTE — Telephone Encounter (Signed)
Copied from CRM [id number]. Topic: General - Other >> Mar 19, 2018  4:10 PM [name], Taylor L, NT wrote: Reason for CRM: patient is calling and states he was seen on 03/18/18 to a shoulder injury. He states that it is still bothering him and that he can feel it sliding out of place. He states he [facility] this [facility] but he did not seem to have concern. He was written out of work for 03/18/18. Patient would like to be written out of work a couple more days due to working at [employer] and having to lift boxes over head. Patient states he can come and pick up the note up. Please contact patient.

## 2018-03-20 NOTE — ED Triage Notes (Addendum)
Pt here for right shoulder injury that occurred Tuesday. He reports that he works at [employer] and was on the [employer] when he fell landing on the right shoulder. He was seen 2 days ago with normal exam , given flexeril, NSAID's and work note.

## 2018-03-20 NOTE — Telephone Encounter (Signed)
Patient seen at [facility] urgent care today. Provider, please advise.

## 2018-03-20 NOTE — ED Provider Notes (Signed)
[facility]    CSN: [id number] Arrival date & time: 03/20/18  1426     History   Chief Complaint Chief Complaint  Patient presents with  . Shoulder Injury    HPI Roselind RilyChristopher A Bruski is a 22 y.o. male.   HPI  Larey SeatFell at work on Tuesday.  Letter overall on right shoulder.  He states he did not put his hands out to catch himself because he broke both of his wrist last week.  He works part-time at [employer] and was on [employer].  At the time he thought his shoulder was painful but not injured.  He still has some soreness in the shoulder.  He went to his PCP.  He was given anti-inflammatories.  His shoulder is still painful but seems to be improving.  He has pain with overhead use.  No prior shoulder problems.  He is here mostly because he works at [employer] on the weekends and needs a note not to do heavy overhead work for couple of days.  History reviewed. No pertinent past medical history.  Patient Active Problem List   Diagnosis Date Noted  . Femur fracture, left (HCC) 05/18/2017    Past Surgical History:  Procedure Laterality Date  . FEMUR FRACTURE SURGERY    . FEMUR FRACTURE SURGERY     ORIF left proximal femur in 3rd grade   . HARDWARE REMOVAL  05/18/2017   Procedure: HARDWARE REMOVAL;  Surgeon: Myrene GalasHandy, Michael, MD;  [facility];  Service: Orthopedics;;  . INTRAMEDULLARY (IM) NAIL INTERTROCHANTERIC Left 05/18/2017   Procedure: INTRAMEDULLARY (IM) NAIL INTERTROCHANTRIC;  Surgeon: Myrene GalasHandy, Michael, MD;  Location: [facility] OR;  Service: Orthopedics;  Laterality: Left;  . KNEE SURGERY     reduction of knee dislocation   . ORIF WRIST FRACTURE Right 05/18/2017  . ORIF WRIST FRACTURE Right 05/18/2017   Procedure: OPEN REDUCTION INTERNAL FIXATION (ORIF) WRIST FRACTURE;  Surgeon: Bradly Bienenstockrtmann, Fred, MD;  Location: [facility] OR;  Service: Orthopedics;  Laterality: Right;       Home Medications    Prior to Admission medications   Medication Sig Start Date End Date Taking? Authorizing Provider    cyclobenzaprine (FLEXERIL) 10 MG tablet Take 0.5-1 tablets (5-10 mg total) by mouth 3 (three) times daily as needed. 03/18/18   Ofilia Neaslark, Michael L, PA-C  ibuprofen (ADVIL,MOTRIN) 200 MG tablet Take 800 mg by mouth every 6 (six) hours as needed (pain).    [provider]  naproxen (NAPROSYN) 500 MG tablet Take 1 tablet (500 mg total) by mouth 2 (two) times daily with a meal. 03/18/18   Ofilia Neaslark, Michael L, PA-C    Family History History reviewed. No pertinent family history.  Social History Social History   Tobacco Use  . Smoking status: Current Some Day Smoker    Years: 3.00    Types: Cigarettes  . Smokeless tobacco: Never Used  Substance Use Topics  . Alcohol use: Yes    Comment: socially  . Drug use: Yes     Allergies   Patient has no known allergies.   Review of Systems Review of Systems  Constitutional: Negative for chills and fever.  HENT: Negative for ear pain and sore throat.   Eyes: Negative for pain and visual disturbance.  Respiratory: Negative for cough and shortness of breath.   Cardiovascular: Negative for chest pain and palpitations.  Gastrointestinal: Negative for abdominal pain and vomiting.  Genitourinary: Negative for dysuria and hematuria.  Musculoskeletal: Positive for arthralgias. Negative for back pain.  Skin:  Negative for color change and rash.  Neurological: Negative for seizures and syncope.  All other systems reviewed and are negative.    Physical Exam Triage Vital Signs ED Triage Vitals  Enc Vitals Group     BP 03/20/18 1548 118/67     Pulse Rate 03/20/18 1548 61     Resp 03/20/18 1548 18     Temp 03/20/18 1548 97.7 F (36.5 C)     Temp src --      SpO2 03/20/18 1548 97 %     Weight --      Height --      Head Circumference --      Peak Flow --      Pain Score 03/20/18 1501 6     Pain Loc --      Pain Edu? --      Excl. in GC? --    No data found.  Updated Vital Signs BP 118/67   Pulse 61   Temp 97.7 F (36.5 C)   Resp  18   SpO2 97%   Visual Acuity Right Eye Distance:   Left Eye Distance:   Bilateral Distance:    Right Eye Near:   Left Eye Near:    Bilateral Near:     Physical Exam  Constitutional: He appears well-developed and well-nourished. No distress.  HENT:  Head: Normocephalic and atraumatic.  Mouth/Throat: Oropharynx is clear and moist.  Eyes: Pupils are equal, round, and reactive to light. Conjunctivae are normal.  Neck: Normal range of motion.  Cardiovascular: Normal rate.  Pulmonary/Chest: Effort normal. No respiratory distress.  Abdominal: Soft. He exhibits no distension.  Musculoskeletal: Normal range of motion. He exhibits no edema.  Right shoulder has mild tenderness posteriorly.  Mild tenderness over AC joint.  Full but slow range of motion.  Lacks the last few degrees of external and internal rotation in the abducted position.  Empty empty can testing is negative.  Neurological: He is alert.  Skin: Skin is warm and dry.     UC Treatments / Results  Labs (all labs ordered are listed, but only abnormal results are displayed) Labs Reviewed - No data to display  EKG None  Radiology No results found.  Procedures Procedures (including critical care time)  Medications Ordered in UC Medications - No data to display  Initial Impression / Assessment and Plan / UC Course  I have reviewed the triage vital signs and the nursing notes.  Pertinent labs & imaging results that were available during my care of the patient were reviewed by me and considered in my medical decision making (see chart for details).      Final Clinical Impressions(s) / UC Diagnoses   Final diagnoses:  Contusion of right shoulder, subsequent encounter     Discharge Instructions     Ice Ibuprofen 3 x a day with food Limit heavy use of arm Return to work [employer]   ED Prescriptions    None     Controlled Substance Prescriptions [place] Controlled Substance Registry consulted? No   Eustace Moore, MD 03/20/18 1620

## 2018-03-20 NOTE — Telephone Encounter (Signed)
Pt went to urgent care. Looks like he got a work note there.

## 2018-03-20 NOTE — Discharge Instructions (Addendum)
Ice Ibuprofen 3 x a day with food Limit heavy use of arm Return to work [employer]

## 2018-04-01 NOTE — Progress Notes (Signed)
Chief Complaint  Patient presents with  . right shoulder pain    x 2 weeks, fell on shoulder and it popped out of place then popped back in place.  Taking naproxen and flexeril given at 102 but no relief for pain just makes pt sleepy.  Per pt shoulder has been popping out of place alot lately and again this morning but will pop back in place.  No movement on pain, pain level 5/10 and when it pops out of place it is a 10/10.      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 No pertinent past medical history.  Current Outpatient Medications  Medication Sig Dispense Refill  . cyclobenzaprine (FLEXERIL) 10 MG tablet Take 0.5-1 tablets (5-10 mg total) by mouth 3 (three) times daily as needed. 30 tablet 0  . ibuprofen (ADVIL,MOTRIN) 200 MG tablet Take 800 mg by mouth every 6 (six) hours as needed (pain).    . naproxen (NAPROSYN) 500 MG tablet Take 1 tablet (500 mg total) by mouth 2 (two) times daily with a meal. 30 tablet 0   No current facility-administered medications for this visit.     Allergies: No Known Allergies  Past Surgical History:  Procedure Laterality Date  . FEMUR FRACTURE SURGERY    . FEMUR FRACTURE SURGERY     ORIF left proximal femur in 3rd grade   . HARDWARE REMOVAL  05/18/2017   Procedure: HARDWARE REMOVAL;  Surgeon: Myrene Galas, MD;  Location: [facility];  Service: Orthopedics;;  . INTRAMEDULLARY (IM) NAIL INTERTROCHANTERIC Left 05/18/2017   Procedure: INTRAMEDULLARY (IM) NAIL INTERTROCHANTRIC;  Surgeon: Myrene Galas, MD;  [facility];  Service: Orthopedics;  Laterality: Left;  .  KNEE SURGERY     reduction of knee dislocation   . ORIF WRIST FRACTURE Right 05/18/2017  . ORIF WRIST FRACTURE Right 05/18/2017   Procedure: OPEN REDUCTION INTERNAL FIXATION (ORIF) WRIST FRACTURE;  Surgeon: Bradly Bienenstock, MD;  Location: [facility];  Service: Orthopedics;  Laterality: Right;    Social History   Socioeconomic History  . Marital status: Single    Spouse name: Not on file  . Number of children: Not on file  . Years of education: Not on file  . Highest education level: Not on file  Occupational History  . Not on file  Social Needs  . Financial resource strain: Not on file  . Food insecurity:    Worry: Not on file    Inability: Not on file  . Transportation needs:    Medical: Not on file    Non-medical: Not on file  Tobacco Use  . Smoking status: Current Some Day Smoker    Years: 3.00    Types: Cigarettes  . Smokeless tobacco: Never Used  Substance and Sexual Activity  . Alcohol use: Yes    Comment: socially  . Drug use: Yes  . Sexual activity: Not on file  Lifestyle  . Physical activity:    Days per week: Not on file    Minutes per session: Not on file  . Stress:  Not on file  Relationships  . Social connections:    Talks on phone: Not on file    Gets together: Not on file    Attends religious service: Not on file    Active member of club or organization: Not on file    Attends meetings of clubs or organizations: Not on file    Relationship status: Not on file  Other Topics Concern  . Not on file  Social History Narrative   ** Merged History Encounter **        History reviewed. No pertinent family history.   ROS Review of Systems See HPI Constitution: No fevers or chills No malaise No diaphoresis Skin: No rash or itching Eyes: no blurry vision, no double vision GU: no dysuria or hematuria Neuro: no dizziness or headaches  all others reviewed and negative   Objective: Vitals:   04/02/18 0944  BP: 127/77  Pulse: 72  Resp: 17  Temp: 99.2  F (37.3 C)  TempSrc: Oral  SpO2: 100%  Weight: 128 lb 3.2 oz (58.2 kg)  Height: 5\' 10"  (1.778 m)    Physical Exam  Constitutional: He is oriented to person, place, and time. He appears well-developed and well-nourished.  HENT:  Head: Normocephalic and atraumatic.  Eyes: Conjunctivae and EOM are normal.  Cardiovascular: Normal rate, regular rhythm and normal heart sounds.  No murmur heard. Pulmonary/Chest: Breath sounds normal. No stridor. No respiratory distress.  Neurological: He is oriented to person, place, and time.     Right shoulder with normal range of motion in all planes No AC joint tenderness Very good internal and external rotation Strength of UE 5/5 No deformity No crepitus   Assessment and Plan [name] was seen today for right shoulder pain.  Diagnoses and all orders for this visit:  Overuse injury Acute pain of right shoulder  Referral placed for PT Continue nsaid Gave work restrictions for light duty for 2 weeks Gave number to call PT today He was a previous [facility]    Royalti Schauf A [employer]

## 2018-04-02 ENCOUNTER — Encounter: Payer: Self-pay | Admitting: Family Medicine

## 2018-04-02 ENCOUNTER — Other Ambulatory Visit: Payer: Self-pay

## 2018-04-02 ENCOUNTER — Ambulatory Visit: Payer: 59 | Admitting: Family Medicine

## 2018-04-02 VITALS — BP 127/77 | HR 72 | Temp 99.2°F | Resp 17 | Ht 70.0 in | Wt 128.2 lb

## 2018-04-02 DIAGNOSIS — S4991XA Unspecified injury of right shoulder and upper arm, initial encounter: Secondary | ICD-10-CM

## 2018-04-02 DIAGNOSIS — M25511 Pain in right shoulder: Secondary | ICD-10-CM | POA: Diagnosis not present

## 2018-04-02 DIAGNOSIS — T148XXA Other injury of unspecified body region, initial encounter: Principal | ICD-10-CM

## 2018-04-02 DIAGNOSIS — X503XXA Overexertion from repetitive movements, initial encounter: Secondary | ICD-10-CM

## 2018-04-02 NOTE — Patient Instructions (Addendum)
[facility]- [phone] [facility]- [address] [phone]  [facility]  [phone]        IF you received an x-ray today, you will receive an invoice from [facility]. Please contact [facility] at [phone] with questions or concerns regarding your invoice.   IF you received labwork today, you will receive an invoice from [facility]. Please contact [facility] at [phone] with questions or concerns regarding your invoice.   Our billing staff will not be able to assist you with questions regarding bills from these companies.  You will be contacted with the lab results as soon as they are available. The fastest way to get your results is to activate your My Chart account. Instructions are located on the last page of this paperwork. If you have not heard from us regarding the results in 2 weeks, please contact this office.     Shoulder Pain Many things can cause shoulder pain, including:  An injury to the area.  Overuse of the shoulder.  Arthritis.  The source of the pain can be:  Inflammation.  An injury to the shoulder joint.  An injury to a tendon, ligament, or bone.  Follow these instructions at home: Take these actions to help with your pain:  Squeeze a soft ball or a foam pad as much as possible. This helps to keep the shoulder from swelling. It also helps to strengthen the arm.  Take over-the-counter and prescription medicines only as told by your health care provider.  If directed, apply ice to the area: ? Put ice in a plastic bag. ? Place a towel between your skin and the bag. ? Leave the ice on for 20 minutes, 2-3 times per day. Stop applying ice if it does not help with the pain.  If you were given a shoulder sling or immobilizer: ? Wear it as told. ? Remove it to shower or bathe. ? Move your arm as little as possible, but keep your hand moving to prevent  swelling.  Contact a health care provider if:  Your pain gets worse.  Your pain is not relieved with medicines.  New pain develops in your arm, hand, or fingers. Get help right away if:  Your arm, hand, or fingers: ? Tingle. ? Become numb. ? Become swollen. ? Become painful. ? Turn white or blue. This information is not intended to replace advice given to you by your health care provider. Make sure you discuss any questions you have with your health care provider. Document Released: 07/10/2005 Document Revised: 05/26/2016 Document Reviewed: 01/23/2015 Elsevier Interactive Patient Education  [employer]2018 Elsevier Inc.

## 2018-04-14 ENCOUNTER — Telehealth: Payer: Self-pay | Admitting: Family Medicine

## 2018-04-14 NOTE — Telephone Encounter (Signed)
Copied from CRM [id number]. Topic: Inquiry >> Apr 14, 2018 12:04 PM [name], Taylor L, NT wrote: Reason for CRM: patient is calling and states that he was given multiple numbers to specialist that could see him for his shoulder pain. Including physical therapy. Patient states he misplaced the paper and would like the nurse to contact him with the phone numbers. Please advise.

## 2018-04-15 ENCOUNTER — Telehealth: Payer: Self-pay | Admitting: *Deleted

## 2018-04-15 NOTE — Telephone Encounter (Signed)
Patient called back and was given the information on the three PT facilities

## 2018-04-15 NOTE — Telephone Encounter (Signed)
  Unable to leave message no voicemail  Can we give patient these numbers ...   [facility]- [phone] [facility]- [address] [phone] [facility] [phone]

## 2018-04-24 DIAGNOSIS — M25511 Pain in right shoulder: Secondary | ICD-10-CM | POA: Diagnosis not present

## 2018-05-02 DIAGNOSIS — M25511 Pain in right shoulder: Secondary | ICD-10-CM | POA: Diagnosis not present

## 2018-05-15 DIAGNOSIS — S43431D Superior glenoid labrum lesion of right shoulder, subsequent encounter: Secondary | ICD-10-CM | POA: Diagnosis not present

## 2018-05-15 DIAGNOSIS — M25511 Pain in right shoulder: Secondary | ICD-10-CM | POA: Diagnosis not present

## 2018-06-04 DIAGNOSIS — S43081A Other subluxation of right shoulder joint, initial encounter: Secondary | ICD-10-CM | POA: Diagnosis not present

## 2018-06-04 DIAGNOSIS — M24411 Recurrent dislocation, right shoulder: Secondary | ICD-10-CM | POA: Diagnosis not present

## 2018-06-04 DIAGNOSIS — G8918 Other acute postprocedural pain: Secondary | ICD-10-CM | POA: Diagnosis not present

## 2018-06-04 DIAGNOSIS — S43431A Superior glenoid labrum lesion of right shoulder, initial encounter: Secondary | ICD-10-CM | POA: Diagnosis not present

## 2018-06-04 DIAGNOSIS — M25311 Other instability, right shoulder: Secondary | ICD-10-CM | POA: Diagnosis not present

## 2018-07-09 DIAGNOSIS — M25511 Pain in right shoulder: Secondary | ICD-10-CM | POA: Diagnosis not present

## 2018-07-17 DIAGNOSIS — M25511 Pain in right shoulder: Secondary | ICD-10-CM | POA: Diagnosis not present

## 2018-08-04 DIAGNOSIS — M25511 Pain in right shoulder: Secondary | ICD-10-CM | POA: Diagnosis not present

## 2018-08-06 DIAGNOSIS — M25511 Pain in right shoulder: Secondary | ICD-10-CM | POA: Diagnosis not present

## 2018-08-11 DIAGNOSIS — M25511 Pain in right shoulder: Secondary | ICD-10-CM | POA: Diagnosis not present

## 2018-08-13 DIAGNOSIS — M25511 Pain in right shoulder: Secondary | ICD-10-CM | POA: Diagnosis not present

## 2018-08-20 DIAGNOSIS — M25511 Pain in right shoulder: Secondary | ICD-10-CM | POA: Diagnosis not present

## 2018-08-25 DIAGNOSIS — M25511 Pain in right shoulder: Secondary | ICD-10-CM | POA: Diagnosis not present

## 2018-08-28 DIAGNOSIS — M25511 Pain in right shoulder: Secondary | ICD-10-CM | POA: Diagnosis not present

## 2018-09-01 DIAGNOSIS — M25511 Pain in right shoulder: Secondary | ICD-10-CM | POA: Diagnosis not present

## 2018-09-15 DIAGNOSIS — M25511 Pain in right shoulder: Secondary | ICD-10-CM | POA: Diagnosis not present

## 2018-09-17 DIAGNOSIS — M25511 Pain in right shoulder: Secondary | ICD-10-CM | POA: Diagnosis not present

## 2018-09-28 DIAGNOSIS — Z9889 Other specified postprocedural states: Secondary | ICD-10-CM | POA: Diagnosis not present

## 2018-12-13 HISTORY — PX: ROTATOR CUFF REPAIR: SHX139

## 2020-02-17 ENCOUNTER — Other Ambulatory Visit: Payer: Self-pay

## 2020-02-17 ENCOUNTER — Encounter (HOSPITAL_COMMUNITY): Payer: Self-pay | Admitting: *Deleted

## 2020-02-17 ENCOUNTER — Emergency Department (HOSPITAL_COMMUNITY): Payer: BC Managed Care – PPO

## 2020-02-17 ENCOUNTER — Emergency Department (HOSPITAL_COMMUNITY)
Admission: EM | Admit: 2020-02-17 | Discharge: 2020-02-17 | Disposition: A | Discharge status: Home / Self Care | Payer: BC Managed Care – PPO | Attending: Emergency Medicine | Admitting: Emergency Medicine

## 2020-02-17 DIAGNOSIS — Y939 Activity, unspecified: Secondary | ICD-10-CM | POA: Diagnosis not present

## 2020-02-17 DIAGNOSIS — Y9301 Activity, walking, marching and hiking: Secondary | ICD-10-CM | POA: Diagnosis not present

## 2020-02-17 DIAGNOSIS — W3400XA Accidental discharge from unspecified firearms or gun, initial encounter: Secondary | ICD-10-CM | POA: Diagnosis not present

## 2020-02-17 DIAGNOSIS — S82191A Other fracture of upper end of right tibia, initial encounter for closed fracture: Secondary | ICD-10-CM

## 2020-02-17 DIAGNOSIS — S81031A Puncture wound without foreign body, right knee, initial encounter: Secondary | ICD-10-CM

## 2020-02-17 DIAGNOSIS — Y999 Unspecified external cause status: Secondary | ICD-10-CM | POA: Diagnosis not present

## 2020-02-17 DIAGNOSIS — Y929 Unspecified place or not applicable: Secondary | ICD-10-CM | POA: Diagnosis not present

## 2020-02-17 DIAGNOSIS — S81841A Puncture wound with foreign body, right lower leg, initial encounter: Secondary | ICD-10-CM | POA: Insufficient documentation

## 2020-02-17 DIAGNOSIS — S72414A Nondisplaced unspecified condyle fracture of lower end of right femur, initial encounter for closed fracture: Secondary | ICD-10-CM | POA: Diagnosis not present

## 2020-02-17 DIAGNOSIS — F17210 Nicotine dependence, cigarettes, uncomplicated: Secondary | ICD-10-CM | POA: Diagnosis not present

## 2020-02-17 DIAGNOSIS — S72409A Unspecified fracture of lower end of unspecified femur, initial encounter for closed fracture: Secondary | ICD-10-CM

## 2020-02-17 HISTORY — DX: Puncture wound without foreign body, right knee, initial encounter: S81.031A

## 2020-02-17 LAB — CBC WITH DIFFERENTIAL/PLATELET
Abs Immature Granulocytes: 0.05 10*3/uL (ref 0.00–0.07)
Basophils Absolute: 0 10*3/uL (ref 0.0–0.1)
Basophils Relative: 0 %
Eosinophils Absolute: 0 10*3/uL (ref 0.0–0.5)
Eosinophils Relative: 0 %
HCT: 43.1 % (ref 39.0–52.0)
Hemoglobin: 15 g/dL (ref 13.0–17.0)
Immature Granulocytes: 1 %
Lymphocytes Relative: 21 %
Lymphs Abs: 1.8 10*3/uL (ref 0.7–4.0)
MCH: 30.9 pg (ref 26.0–34.0)
MCHC: 34.8 g/dL (ref 30.0–36.0)
MCV: 88.7 fL (ref 80.0–100.0)
Monocytes Absolute: 0.4 10*3/uL (ref 0.1–1.0)
Monocytes Relative: 4 %
Neutro Abs: 6.4 10*3/uL (ref 1.7–7.7)
Neutrophils Relative %: 74 %
Platelets: 333 10*3/uL (ref 150–400)
RBC: 4.86 MIL/uL (ref 4.22–5.81)
RDW: 11.7 % (ref 11.5–15.5)
WBC: 8.6 10*3/uL (ref 4.0–10.5)
nRBC: 0 % (ref 0.0–0.2)

## 2020-02-17 LAB — BASIC METABOLIC PANEL
Anion gap: 12 (ref 5–15)
BUN: 8 mg/dL (ref 6–20)
CO2: 24 mmol/L (ref 22–32)
Calcium: 9.2 mg/dL (ref 8.9–10.3)
Chloride: 106 mmol/L (ref 98–111)
Creatinine, Ser: 0.98 mg/dL (ref 0.61–1.24)
GFR calc Af Amer: >60 mL/min (ref >60)
GFR calc non Af Amer: >60 mL/min (ref >60)
Glucose, Bld: 126 mg/dL — ABNORMAL HIGH (ref 70–99)
Potassium: 3.8 mmol/L (ref 3.5–5.1)
Sodium: 142 mmol/L (ref 135–145)

## 2020-02-17 LAB — CDS SEROLOGY

## 2020-02-17 MED ORDER — HYDROCODONE-ACETAMINOPHEN 5-325 MG PO TABS
1.0000 | ORAL_TABLET | Freq: Once | ORAL | Status: AC
  Administered 2020-02-17: 1 via ORAL
  Filled 2020-02-17: qty 1

## 2020-02-17 MED ORDER — FENTANYL CITRATE (PF) 100 MCG/2ML IJ SOLN
INTRAMUSCULAR | Status: AC
  Administered 2020-02-17: 100 ug
  Filled 2020-02-17: qty 2

## 2020-02-17 MED ORDER — CEFAZOLIN SODIUM-DEXTROSE 1-4 GM/50ML-% IV SOLN
1.0000 g | Freq: Once | INTRAVENOUS | Status: AC
  Administered 2020-02-17: 08:00:00 1 g via INTRAVENOUS
  Filled 2020-02-17: qty 50

## 2020-02-17 MED ORDER — HYDROMORPHONE HCL 1 MG/ML IJ SOLN
INTRAMUSCULAR | Status: AC
  Filled 2020-02-17: qty 1

## 2020-02-17 MED ORDER — FENTANYL CITRATE (PF) 100 MCG/2ML IJ SOLN: 100.0000 ug | Freq: Once | INTRAMUSCULAR | Status: DC

## 2020-02-17 MED ORDER — HYDROMORPHONE HCL 1 MG/ML IJ SOLN
1.0000 mg | Freq: Once | INTRAMUSCULAR | Status: AC
  Administered 2020-02-17: 09:00:00 1 mg via INTRAVENOUS

## 2020-02-17 MED ORDER — ACETAMINOPHEN 500 MG PO TABS
1000.0000 mg | ORAL_TABLET | Freq: Three times a day (TID) | ORAL | 0 refills | Status: AC
Start: 2020-02-17 — End: 2020-02-22

## 2020-02-17 MED ORDER — HYDROMORPHONE HCL 1 MG/ML IJ SOLN
0.5000 mg | Freq: Once | INTRAMUSCULAR | Status: AC
  Administered 2020-02-17: 0.5 mg via INTRAVENOUS
  Filled 2020-02-17: qty 1

## 2020-02-17 MED ORDER — HYDROCODONE-ACETAMINOPHEN 5-325 MG PO TABS: 1.0000 | ORAL_TABLET | Freq: Three times a day (TID) | ORAL | 0 refills | Status: AC | PRN
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
## 2020-02-17 NOTE — Progress Notes (Signed)
Orthopedic Tech Progress Note Patient Details:  Gerald Munoz 1996-03-27 [id number] Applied dressing to patient leg and ACE WRAP then applied KNEE IMMOBILIZER with RN at bedside. Patient stated he had used CRUTCHES before Ortho Devices Type of Ortho Device: Ace wrap, Knee Immobilizer, Crutches Ortho Device/Splint Location: RLE Ortho Device/Splint Interventions: Application, Ordered   Post Interventions Patient Tolerated: Fair Instructions Provided: Care of device   Donald Pore 02/17/2020, 9:25 AM
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
## 2020-02-17 NOTE — Discharge Instructions (Addendum)
You need to follow up with Dr Jena Gauss in the office in 5-7 days.  The number is provided above.  Please keep your brace on at ALL TIMES until you are cleared to remove it by Dr Jena Gauss.  Pain medication prescriptions were sent to your pharmacy.

## 2020-02-17 NOTE — ED Notes (Signed)
Patient doesn't want family called at this time.

## 2020-02-18 ENCOUNTER — Encounter: Payer: Self-pay | Admitting: Family Medicine

## 2020-05-24 ENCOUNTER — Ambulatory Visit (HOSPITAL_COMMUNITY)
Admission: EM | Admit: 2020-05-24 | Discharge: 2020-05-24 | Disposition: A | Discharge status: Home / Self Care | Payer: BC Managed Care – PPO

## 2020-05-24 ENCOUNTER — Other Ambulatory Visit: Payer: Self-pay

## 2020-05-24 NOTE — ED Notes (Signed)
Did not answer x2.

## 2020-05-24 NOTE — ED Notes (Signed)
Marcelino Duster CMA called for patient x 1

## 2020-06-08 ENCOUNTER — Ambulatory Visit (INDEPENDENT_AMBULATORY_CARE_PROVIDER_SITE_OTHER): Payer: BC Managed Care – PPO | Admitting: Family Medicine

## 2020-06-08 ENCOUNTER — Encounter: Payer: Self-pay | Admitting: Family Medicine

## 2020-06-08 ENCOUNTER — Other Ambulatory Visit: Payer: Self-pay

## 2020-06-08 VITALS — BP 104/71 | HR 73 | Temp 98.0°F | Ht 69.0 in | Wt 126.0 lb

## 2020-06-08 DIAGNOSIS — S81031S Puncture wound without foreign body, right knee, sequela: Secondary | ICD-10-CM

## 2020-06-08 DIAGNOSIS — S81001S Unspecified open wound, right knee, sequela: Secondary | ICD-10-CM

## 2020-06-08 DIAGNOSIS — W3400XS Accidental discharge from unspecified firearms or gun, sequela: Secondary | ICD-10-CM

## 2020-06-08 DIAGNOSIS — S81801S Unspecified open wound, right lower leg, sequela: Secondary | ICD-10-CM | POA: Diagnosis not present

## 2020-06-08 DIAGNOSIS — S91001S Unspecified open wound, right ankle, sequela: Secondary | ICD-10-CM

## 2020-06-08 DIAGNOSIS — S81031A Puncture wound without foreign body, right knee, initial encounter: Secondary | ICD-10-CM | POA: Insufficient documentation

## 2020-06-08 MED ORDER — DOXYCYCLINE HYCLATE 100 MG PO TABS: 100.0000 mg | ORAL_TABLET | Freq: Two times a day (BID) | ORAL | 1 refills | Status: DC

## 2020-06-08 NOTE — Patient Instructions (Addendum)
  You will be started on doxycycline 100 mg twice daily.  This kind of a wound may take 3 or 4 weeks to heal up on antibiotics if the antibiotic indeed heals.  If you have foreign object in there, it may not get well with antibiotics alone.  Referral has been made to the [facility] orthopedic group.  Culture report is pending.  It may take for 5 days or more.  Return as needed   If you have lab work done today you will be contacted with your lab results within the next 2 weeks.  If you have not heard from [place] then please contact us. The fastest way to get your results is to register for My Chart.   IF you received an x-ray today, you will receive an invoice from [facility] Radiology. Please contact [facility] Radiology at [phone] with questions or concerns regarding your invoice.   IF you received labwork today, you will receive an invoice from [place]. Please contact [facility] at [phone] with questions or concerns regarding your invoice.   Our billing staff will not be able to assist you with questions regarding bills from these companies.  You will be contacted with the lab results as soon as they are available. The fastest way to get your results is to activate your My Chart account. Instructions are located on the last page of this paperwork. If you have not heard from [place] regarding the results in 2 weeks, please contact this office.
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
## 2020-06-13 LAB — WOUND CULTURE: Organism ID, Bacteria: NONE SEEN

## 2020-06-16 ENCOUNTER — Encounter (HOSPITAL_BASED_OUTPATIENT_CLINIC_OR_DEPARTMENT_OTHER): Payer: Self-pay | Admitting: Orthopedic Surgery

## 2020-06-16 ENCOUNTER — Other Ambulatory Visit: Payer: Self-pay

## 2020-06-16 NOTE — Progress Notes (Signed)
Spoke w/ via phone for pre-op interview--- PT Lab needs dos----  No per anes. (pre-op orders pending)            Lab results------ no COVID test ------ 06-17-2020 @ 1010 Arrive at ------- 1145 NPO after MN NO Solid Food.  Clear liquids from MN until--- 1045 Medications to take morning of surgery ----- NONE Diabetic medication ----- n/a Patient Special Instructions ----- n/a Pre-Op special Istructions ----- n/a Patient verbalized understanding of instructions that were given at this phone interview. Patient denies shortness of breath, chest pain, fever, cough at this phone interview.

## 2020-06-17 ENCOUNTER — Other Ambulatory Visit (HOSPITAL_COMMUNITY)
Admission: RE | Admit: 2020-06-17 | Discharge: 2020-06-17 | Disposition: A | Discharge status: Home / Self Care | Payer: BC Managed Care – PPO | Source: Ambulatory Visit | Attending: Orthopedic Surgery | Admitting: Orthopedic Surgery

## 2020-06-17 DIAGNOSIS — Z01812 Encounter for preprocedural laboratory examination: Secondary | ICD-10-CM | POA: Diagnosis not present

## 2020-06-17 DIAGNOSIS — Z20822 Contact with and (suspected) exposure to covid-19: Secondary | ICD-10-CM | POA: Diagnosis not present

## 2020-06-17 LAB — SARS CORONAVIRUS 2 (TAT 6-24 HRS): SARS Coronavirus 2: NEGATIVE

## 2020-06-20 ENCOUNTER — Ambulatory Visit (HOSPITAL_BASED_OUTPATIENT_CLINIC_OR_DEPARTMENT_OTHER): Payer: BC Managed Care – PPO | Admitting: Anesthesiology

## 2020-06-20 ENCOUNTER — Ambulatory Visit (HOSPITAL_BASED_OUTPATIENT_CLINIC_OR_DEPARTMENT_OTHER)
Admission: RE | Admit: 2020-06-20 | Discharge: 2020-06-20 | Disposition: A | Discharge status: Home / Self Care | Payer: BC Managed Care – PPO | Attending: Orthopedic Surgery | Admitting: Orthopedic Surgery

## 2020-06-20 ENCOUNTER — Other Ambulatory Visit: Payer: Self-pay

## 2020-06-20 ENCOUNTER — Encounter (HOSPITAL_BASED_OUTPATIENT_CLINIC_OR_DEPARTMENT_OTHER): Payer: Self-pay | Admitting: Orthopedic Surgery

## 2020-06-20 ENCOUNTER — Encounter (HOSPITAL_BASED_OUTPATIENT_CLINIC_OR_DEPARTMENT_OTHER)
Admission: RE | Disposition: A | Discharge status: Home / Self Care | Payer: Self-pay | Source: Home / Self Care | Attending: Orthopedic Surgery

## 2020-06-20 DIAGNOSIS — M795 Residual foreign body in soft tissue: Secondary | ICD-10-CM | POA: Insufficient documentation

## 2020-06-20 DIAGNOSIS — M659 Synovitis and tenosynovitis, unspecified: Secondary | ICD-10-CM | POA: Insufficient documentation

## 2020-06-20 DIAGNOSIS — M25161 Fistula, right knee: Secondary | ICD-10-CM | POA: Diagnosis not present

## 2020-06-20 DIAGNOSIS — M24661 Ankylosis, right knee: Secondary | ICD-10-CM | POA: Insufficient documentation

## 2020-06-20 DIAGNOSIS — S81001A Unspecified open wound, right knee, initial encounter: Secondary | ICD-10-CM | POA: Diagnosis not present

## 2020-06-20 DIAGNOSIS — M25461 Effusion, right knee: Secondary | ICD-10-CM | POA: Insufficient documentation

## 2020-06-20 DIAGNOSIS — X58XXXA Exposure to other specified factors, initial encounter: Secondary | ICD-10-CM | POA: Diagnosis not present

## 2020-06-20 DIAGNOSIS — F17210 Nicotine dependence, cigarettes, uncomplicated: Secondary | ICD-10-CM | POA: Diagnosis not present

## 2020-06-20 HISTORY — PX: KNEE ARTHROSCOPY: SHX127

## 2020-06-20 HISTORY — DX: Stiffness of right knee, not elsewhere classified: M25.661

## 2020-06-20 SURGERY — ARTHROSCOPY, KNEE
Anesthesia: General | Site: Knee | Laterality: Right

## 2020-06-20 MED ORDER — ACETAMINOPHEN 500 MG PO TABS
1000.0000 mg | ORAL_TABLET | Freq: Once | ORAL | Status: AC
  Administered 2020-06-20: 1000 mg via ORAL

## 2020-06-20 MED ORDER — HYDROCODONE-ACETAMINOPHEN 5-325 MG PO TABS
1.0000 | ORAL_TABLET | ORAL | 0 refills | Status: DC | PRN
Start: 2020-06-20 — End: 2024-03-17

## 2020-06-20 MED ORDER — PROPOFOL 10 MG/ML IV BOLUS
INTRAVENOUS | Status: DC | PRN
  Administered 2020-06-20: 200 mg via INTRAVENOUS

## 2020-06-20 MED ORDER — BUPIVACAINE-EPINEPHRINE 0.25% -1:200000 IJ SOLN
INTRAMUSCULAR | Status: AC
  Filled 2020-06-20: qty 1

## 2020-06-20 MED ORDER — KETOROLAC TROMETHAMINE 30 MG/ML IJ SOLN
INTRAMUSCULAR | Status: AC
  Filled 2020-06-20: qty 1

## 2020-06-20 MED ORDER — MIDAZOLAM HCL 5 MG/5ML IJ SOLN
INTRAMUSCULAR | Status: DC | PRN
  Administered 2020-06-20: 2 mg via INTRAVENOUS

## 2020-06-20 MED ORDER — CEFAZOLIN SODIUM-DEXTROSE 2-4 GM/100ML-% IV SOLN
INTRAVENOUS | Status: AC
  Filled 2020-06-20: qty 100

## 2020-06-20 MED ORDER — DEXAMETHASONE SODIUM PHOSPHATE 10 MG/ML IJ SOLN
INTRAMUSCULAR | Status: AC
  Filled 2020-06-20: qty 1

## 2020-06-20 MED ORDER — ONDANSETRON HCL 4 MG/2ML IJ SOLN
INTRAMUSCULAR | Status: DC | PRN
  Administered 2020-06-20: 4 mg via INTRAVENOUS

## 2020-06-20 MED ORDER — FENTANYL CITRATE (PF) 100 MCG/2ML IJ SOLN
INTRAMUSCULAR | Status: AC
  Filled 2020-06-20: qty 2

## 2020-06-20 MED ORDER — LIDOCAINE 2% (20 MG/ML) 5 ML SYRINGE
INTRAMUSCULAR | Status: AC
  Filled 2020-06-20: qty 5

## 2020-06-20 MED ORDER — CEFAZOLIN SODIUM-DEXTROSE 2-4 GM/100ML-% IV SOLN
2.0000 g | INTRAVENOUS | Status: AC
  Administered 2020-06-20: 2 g via INTRAVENOUS

## 2020-06-20 MED ORDER — KETOROLAC TROMETHAMINE 30 MG/ML IJ SOLN
INTRAMUSCULAR | Status: DC | PRN
  Administered 2020-06-20: 30 mg via INTRAVENOUS

## 2020-06-20 MED ORDER — LIDOCAINE 2% (20 MG/ML) 5 ML SYRINGE
INTRAMUSCULAR | Status: DC | PRN
  Administered 2020-06-20: 60 mg via INTRAVENOUS

## 2020-06-20 MED ORDER — LACTATED RINGERS IV SOLN: INTRAVENOUS | Status: DC

## 2020-06-20 MED ORDER — ACETAMINOPHEN 500 MG PO TABS
ORAL_TABLET | ORAL | Status: AC
  Filled 2020-06-20: qty 2

## 2020-06-20 MED ORDER — DEXAMETHASONE SODIUM PHOSPHATE 4 MG/ML IJ SOLN
INTRAMUSCULAR | Status: DC | PRN
  Administered 2020-06-20: 10 mg via INTRAVENOUS

## 2020-06-20 MED ORDER — WHITE PETROLATUM EX OINT
TOPICAL_OINTMENT | CUTANEOUS | Status: AC
  Filled 2020-06-20: qty 5

## 2020-06-20 MED ORDER — ONDANSETRON HCL 4 MG PO TABS: 4.0000 mg | ORAL_TABLET | Freq: Every day | ORAL | 0 refills | Status: DC | PRN

## 2020-06-20 MED ORDER — SODIUM CHLORIDE 0.9 % IR SOLN
Status: DC | PRN
  Administered 2020-06-20: 6000 mL

## 2020-06-20 MED ORDER — ONDANSETRON HCL 4 MG/2ML IJ SOLN
INTRAMUSCULAR | Status: AC
  Filled 2020-06-20: qty 2

## 2020-06-20 MED ORDER — BUPIVACAINE-EPINEPHRINE (PF) 0.25% -1:200000 IJ SOLN
INTRAMUSCULAR | Status: DC | PRN
  Administered 2020-06-20: 30 mL via PERINEURAL

## 2020-06-20 MED ORDER — FENTANYL CITRATE (PF) 100 MCG/2ML IJ SOLN
25.0000 ug | INTRAMUSCULAR | Status: DC | PRN
  Administered 2020-06-20: 25 ug via INTRAVENOUS

## 2020-06-20 MED ORDER — FENTANYL CITRATE (PF) 100 MCG/2ML IJ SOLN
INTRAMUSCULAR | Status: DC | PRN
Start: 2020-06-20 — End: 2020-06-20
  Administered 2020-06-20: 50 ug via INTRAVENOUS
  Administered 2020-06-20: 100 ug via INTRAVENOUS
  Administered 2020-06-20: 50 ug via INTRAVENOUS

## 2020-06-20 MED ORDER — MIDAZOLAM HCL 2 MG/2ML IJ SOLN
INTRAMUSCULAR | Status: AC
  Filled 2020-06-20: qty 2

## 2020-06-20 SURGICAL SUPPLY — 51 items
ABLATOR ASPIRATE 50D MULTI-PRT (SURGICAL WAND) IMPLANT
BANDAGE ESMARK 6X9 LF (GAUZE/BANDAGES/DRESSINGS) IMPLANT
BLADE SHAVER TORPEDO 4X13 (MISCELLANEOUS) ×3 IMPLANT
BLADE SURG 15 STRL LF DISP TIS (BLADE) ×1 IMPLANT
BLADE SURG 15 STRL SS (BLADE) ×3
BNDG CMPR 9X6 STRL LF SNTH (GAUZE/BANDAGES/DRESSINGS)
BNDG ELASTIC 6X5.8 VLCR STR LF (GAUZE/BANDAGES/DRESSINGS) ×3 IMPLANT
BNDG ESMARK 6X9 LF (GAUZE/BANDAGES/DRESSINGS)
CNTNR URN SCR LID CUP LEK RST (MISCELLANEOUS) ×1 IMPLANT
CONT SPEC 4OZ STRL OR WHT (MISCELLANEOUS) ×3
COVER WAND RF STERILE (DRAPES) ×3 IMPLANT
CUFF TOURN SGL QUICK 24 (TOURNIQUET CUFF) ×3
CUFF TOURN SGL QUICK 34 (TOURNIQUET CUFF)
CUFF TRNQT CYL 24X4X16.5-23 (TOURNIQUET CUFF) ×1 IMPLANT
CUFF TRNQT CYL 34X4.125X (TOURNIQUET CUFF) IMPLANT
DRAPE ARTHROSCOPY W/POUCH 114 (DRAPES) ×3 IMPLANT
DRAPE C-ARM 35X43 STRL (DRAPES) ×3 IMPLANT
DRAPE U-SHAPE 47X51 STRL (DRAPES) ×3 IMPLANT
DRSG EMULSION OIL 3X3 NADH (GAUZE/BANDAGES/DRESSINGS) ×3 IMPLANT
DRSG PAD ABDOMINAL 8X10 ST (GAUZE/BANDAGES/DRESSINGS) ×3 IMPLANT
DURAPREP 26ML APPLICATOR (WOUND CARE) ×3 IMPLANT
GAUZE SPONGE 4X4 12PLY STRL (GAUZE/BANDAGES/DRESSINGS) ×3 IMPLANT
GAUZE SPONGE 4X4 12PLY STRL LF (GAUZE/BANDAGES/DRESSINGS) ×3 IMPLANT
GAUZE XEROFORM 1X8 LF (GAUZE/BANDAGES/DRESSINGS) ×3 IMPLANT
GLOVE BIO SURGEON STRL SZ7.5 (GLOVE) ×3 IMPLANT
GLOVE BIOGEL PI IND STRL 8 (GLOVE) ×1 IMPLANT
GLOVE BIOGEL PI INDICATOR 8 (GLOVE) ×2
GOWN STRL REUS W/TWL XL LVL3 (GOWN DISPOSABLE) ×6 IMPLANT
IV NS IRRIG 3000ML ARTHROMATIC (IV SOLUTION) ×6 IMPLANT
KIT TURNOVER CYSTO (KITS) ×3 IMPLANT
MANIFOLD NEPTUNE II (INSTRUMENTS) ×3 IMPLANT
NS IRRIG 500ML POUR BTL (IV SOLUTION) ×3 IMPLANT
PACK ARTHROSCOPY DSU (CUSTOM PROCEDURE TRAY) ×3 IMPLANT
PACK BASIN DAY SURGERY FS (CUSTOM PROCEDURE TRAY) ×3 IMPLANT
PAD ARMBOARD 7.5X6 YLW CONV (MISCELLANEOUS) IMPLANT
PAD CAST 4YDX4 CTTN HI CHSV (CAST SUPPLIES) ×1 IMPLANT
PADDING CAST COTTON 4X4 STRL (CAST SUPPLIES) ×3
PORT APPOLLO RF 90DEGREE MULTI (SURGICAL WAND) IMPLANT
STOCKING KNEE MED LNG (STOCKING) ×3 IMPLANT
SUCTION FRAZIER HANDLE 10FR (MISCELLANEOUS) ×2
SUCTION TUBE FRAZIER 10FR DISP (MISCELLANEOUS) ×1 IMPLANT
SUT ETHILON 4 0 PS 2 18 (SUTURE) ×3 IMPLANT
SUT MON AB 2-0 SH 27 (SUTURE) ×3
SUT MON AB 2-0 SH27 (SUTURE) ×1 IMPLANT
SYR CONTROL 10ML LL (SYRINGE) ×3 IMPLANT
TOWEL OR 17X26 10 PK STRL BLUE (TOWEL DISPOSABLE) ×3 IMPLANT
TUBE CONNECTING 12'X1/4 (SUCTIONS) ×2
TUBE CONNECTING 12X1/4 (SUCTIONS) ×4 IMPLANT
TUBING ARTHROSCOPY IRRIG 16FT (MISCELLANEOUS) ×3 IMPLANT
WATER STERILE IRR 500ML POUR (IV SOLUTION) IMPLANT
WRAP KNEE MAXI GEL POST OP (GAUZE/BANDAGES/DRESSINGS) ×3 IMPLANT
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
## 2020-06-20 NOTE — Anesthesia Postprocedure Evaluation (Signed)
Anesthesia Post Note  Patient: Gerald Munoz  Procedure(s) Performed: Arthroscopic right knee, synovectomy with open forgeign body removal (Right Knee)     Patient location during evaluation: PACU Anesthesia Type: General Level of consciousness: awake and alert Pain management: pain level controlled Vital Signs Assessment: post-procedure vital signs reviewed and stable Respiratory status: spontaneous breathing, nonlabored ventilation, respiratory function stable and patient connected to nasal cannula oxygen Cardiovascular status: blood pressure returned to baseline and stable Postop Assessment: no apparent nausea or vomiting Anesthetic complications: no   No complications documented.  Last Vitals:  Vitals:   06/20/20 1522 06/20/20 1530  BP:  132/78  Pulse:  (!) 59  Resp:  15  Temp:    SpO2: 100% 100%    Last Pain:  Vitals:   06/20/20 1530  TempSrc:   PainSc: 6                  Mackinsey Pelland L Edra Riccardi
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
## 2020-06-20 NOTE — Transfer of Care (Signed)
Immediate Anesthesia Transfer of Care Note  Patient: TAIM WURM  Procedure(s) Performed: Procedure(s) (LRB): Arthroscopic right knee, synovectomy with open forgeign body removal (Right)  Patient Location: PACU  Anesthesia Type: General  Level of Consciousness: awake, oriented, sedated and patient cooperative  Airway & Oxygen Therapy: Patient Spontanous Breathing and Patient connected to face mask oxygen  Post-op Assessment: Report given to PACU RN and Post -op Vital signs reviewed and stable  Post vital signs: Reviewed and stable  Complications: No apparent anesthesia complications Last Vitals:  Vitals Value Taken Time  BP    Temp    Pulse    Resp 10 06/20/20 1518  SpO2    Vitals shown include unvalidated device data.  Last Pain:  Vitals:   06/20/20 1201  TempSrc: Oral  PainSc: 0-No pain      Patients Stated Pain Goal: 5 (06/20/20 1201)  Complications: No complications documented.

## 2020-06-20 NOTE — Anesthesia Procedure Notes (Signed)
Procedure Name: LMA Insertion Date/Time: 06/20/2020 1:58 PM Performed by: Caren Macadam, CRNA Pre-anesthesia Checklist: Patient identified, Emergency Drugs available, Suction available and Patient being monitored Patient Re-evaluated:Patient Re-evaluated prior to induction Oxygen Delivery Method: Circle system utilized Preoxygenation: Pre-oxygenation with 100% oxygen Induction Type: IV induction Ventilation: Mask ventilation without difficulty LMA: LMA inserted LMA Size: 4.0 Number of attempts: 1 Placement Confirmation: positive ETCO2 and breath sounds checked- equal and bilateral Tube secured with: Tape Dental Injury: Teeth and Oropharynx as per pre-operative assessment
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
## 2020-06-20 NOTE — Discharge Instructions (Signed)
Knee Arthroscopy, Care After This sheet gives you information about how to care for yourself after your procedure. Your health care provider may also give you more specific instructions. If you have problems or questions, contact your health care provider. What can I expect after the procedure? After the procedure, it is common to have:  Soreness.  Swelling.  Pain that can be relieved by taking pain medicine. Follow these instructions at home: Incision care   Follow instructions from your health care provider about how to take care of your incisions. Make sure you: ? Wash your hands with soap and water before you change your bandage (dressing). If soap and water are not available, use hand sanitizer. ? Change your dressing as told by your health care provider. ? Leave stitches (sutures), staples, skin glue, or adhesive strips in place. These skin closures may need to stay in place for 2 weeks or longer. If adhesive strip edges start to loosen and curl up, you may trim the loose edges. Do not remove adhesive strips completely unless your health care provider tells you to do that.  Check your incision areas every day for signs of infection. Check for: ? Redness. ? More swelling or pain. ? Fluid or blood. ? Warmth. ? Pus or a bad smell. Bathing  Do not take baths, swim, or use a hot tub until your health care provider approves. Ask your health care provider if you may take showers. You may only be allowed to take sponge baths. Activity  Do not use your knee to support your body weight until your health care provider says that you can. Follow weight-bearing restrictions as told. Use crutches or other devices to help you move around (assistive devices) as directed.  Ask your health care provider what activities are safe for you during recovery, and what activities you need to avoid.  If physical therapy was prescribed, do exercises as directed. Doing exercises may help improve knee  movement and flexibility (range of motion).  Do not lift anything that is heavier than 10 lb (4.5 kg), or the limit that you are told, until your health care provider says that it is safe. Driving  Do not drive until your health care provider approves. You may be able to drive after 1-3 weeks.  Do not drive or use heavy machinery while taking prescription pain medicine. Managing pain, stiffness, and swelling   If directed, put ice on the injured area: ? Put ice in a plastic bag or use the icing device (cold therapy unit) that you were given. Follow instructions from your health care provider about how to use the icing device. ? Place a towel between your skin and the bag or between your skin and the icing device. ? Leave the ice on for 20 minutes, 2-3 times a day.  Move your toes often to avoid stiffness and to lessen swelling.  Raise (elevate) the injured area above the level of your heart while you are sitting or lying down. If you are taking blood thinners:  Before you take any medicines that contain aspirin or NSAIDs, talk with your health care provider. These medicines increase your risk for dangerous bleeding.  Take your medicine exactly as told, at the same time every day.  Avoid activities that could cause injury or bruising, and follow instructions about how to prevent falls.  Wear a medical alert bracelet or carry a card that lists what medicines you take. General instructions  Take over-the-counter and prescription medicines only  as told by your health care provider.  If you are taking prescription pain medicine, take actions to prevent or treat constipation. Your health care provider may recommend that you: ? Drink enough fluid to keep your urine pale yellow. ? Eat foods that are high in fiber, such as fresh fruits and vegetables, whole grains, and beans. ? Limit foods that are high in fat and processed sugars, such as fried or sweet foods. ? Take an over-the-counter  or prescription medicines for constipation.  Do not use any products that contain nicotine or tobacco, such as cigarettes and e-cigarettes. These can delay incision or bone healing. If you need help quitting, ask your health care provider.  Wear compression stockings as told by your health care provider. These stockings help to prevent blood clots and reduce swelling in your legs.  Keep all follow-up visits as told by your health care provider. This is important. Contact a health care provider if you:  Have a fever.  Have severe pain.  Have redness around an incision.  Have more swelling.  Have fluid or blood coming from an incision.  Notice that an incision feels warm to the touch.  Notice pus or a bad smell coming from an incision.  Notice that an incision opens up.  Develop a rash. Get help right away if you:  Have difficulty breathing.  Have shortness of breath.  Have chest pain.  Develop pain in your lower leg or at the back of your knee.  Have numbness or tingling in your lower leg or your foot. Summary  Raise (elevate) the injured area above the level of your heart while you are sitting or lying down.  To help relieve pain and swelling, put ice on your leg for 20 minutes at a time, 2-3 times a day.  If you were prescribed a blood thinner, avoid activities that could cause injury or bruising, and follow instructions about how to prevent falls.  If physical therapy was prescribed, do exercises as directed. Doing exercises may help improve range of motion. This information is not intended to replace advice given to you by your health care provider. Make sure you discuss any questions you have with your health care provider. Document Revised: 09/12/2017 Document Reviewed: 08/13/2017 Elsevier Patient Education  2020 [employer].   Post Anesthesia Home Care Instructions  Activity: Get plenty of rest for the remainder of the day. A responsible individual must  stay with you for 24 hours following the procedure.  For the next 24 hours, DO NOT: -Drive a car -Advertising copywriter -Drink alcoholic beverages -Take any medication unless instructed by your physician -Make any legal decisions or sign important papers.  Meals: Start with liquid foods such as gelatin or soup. Progress to regular foods as tolerated. Avoid greasy, spicy, heavy foods. If nausea and/or vomiting occur, drink only clear liquids until the nausea and/or vomiting subsides. Call your physician if vomiting continues.  Special Instructions/Symptoms: Your throat may feel dry or sore from the anesthesia or the breathing tube placed in your throat during surgery. If this causes discomfort, gargle with warm salt water. The discomfort should disappear within 24 hours.

## 2020-06-21 ENCOUNTER — Encounter (HOSPITAL_BASED_OUTPATIENT_CLINIC_OR_DEPARTMENT_OTHER): Payer: Self-pay | Admitting: Orthopedic Surgery

## 2020-06-22 LAB — AEROBIC CULTURE W GRAM STAIN (SUPERFICIAL SPECIMEN): Culture: NO GROWTH

## 2020-06-25 LAB — ANAEROBIC CULTURE

## 2020-08-21 ENCOUNTER — Ambulatory Visit: Payer: BLUE CROSS/BLUE SHIELD | Admitting: Family Medicine

## 2022-07-23 ENCOUNTER — Other Ambulatory Visit: Payer: Self-pay

## 2022-07-23 ENCOUNTER — Encounter (HOSPITAL_COMMUNITY): Payer: Self-pay | Admitting: Emergency Medicine

## 2022-07-23 ENCOUNTER — Emergency Department (HOSPITAL_COMMUNITY): Payer: BC Managed Care – PPO

## 2022-07-23 ENCOUNTER — Emergency Department (HOSPITAL_COMMUNITY)
Admission: EM | Admit: 2022-07-23 | Discharge: 2022-07-23 | Disposition: A | Discharge status: Home / Self Care | Payer: BC Managed Care – PPO | Attending: Emergency Medicine | Admitting: Emergency Medicine

## 2022-07-23 DIAGNOSIS — M79642 Pain in left hand: Secondary | ICD-10-CM | POA: Diagnosis present

## 2022-07-23 DIAGNOSIS — M79632 Pain in left forearm: Secondary | ICD-10-CM | POA: Insufficient documentation

## 2022-07-23 DIAGNOSIS — R200 Anesthesia of skin: Secondary | ICD-10-CM | POA: Insufficient documentation

## 2022-07-23 MED ORDER — PREDNISONE 20 MG PO TABS
60.0000 mg | ORAL_TABLET | Freq: Once | ORAL | Status: AC
  Administered 2022-07-23: 60 mg via ORAL
  Filled 2022-07-23: qty 3

## 2022-07-23 MED ORDER — PREDNISONE 10 MG (21) PO TBPK: ORAL_TABLET | Freq: Every day | ORAL | 0 refills | Status: DC

## 2022-07-23 NOTE — ED Provider Notes (Signed)
[facility]-EMERGENCY DEPT Provider Note   CSN: [id number] Arrival date & time: 07/23/22  1839     History  Chief Complaint  Patient presents with   Hand Pain    Gerald Munoz is a 26 y.o. male.   Hand Pain   26 year old male presents emergency department with complaints of left hand and forearm tingling as well as left thumb "getting stuck."  Patient states his symptoms began yesterday when he was working a double shift at [employer].  He noticed as he continued to move boxes, his left forearm began to feel weak and fatigued.  He subsequently noticed numbness in his left hand that then extended proximally to the distal aspect of his forearm.  Denies loss of strength/range of motion.  Has prior fracture to area.  Denies fever, chills, night sweats.  No significant past medical history.  Home Medications Prior to Admission medications   Medication Sig Start Date End Date Taking? Authorizing Provider  predniSONE (STERAPRED UNI-PAK 21 TAB) 10 MG (21) TBPK tablet Take by mouth daily. Take 6 tabs by mouth daily  for 2 days, then 5 tabs for 2 days, then 4 tabs for 2 days, then 3 tabs for 2 days, 2 tabs for 2 days, then 1 tab by mouth daily for 2 days 07/23/22  Yes Sherian Maroon A, PA  HYDROcodone-acetaminophen (NORCO/VICODIN) 5-325 MG tablet Take 1 tablet by mouth every 4 (four) hours as needed for up to 30 doses for moderate pain or severe pain. 06/20/20   McClung, Fuller Plan D, PA  ibuprofen (ADVIL,MOTRIN) 200 MG tablet Take 800 mg by mouth every 6 (six) hours as needed (pain).     [provider]  ondansetron (ZOFRAN) 4 MG tablet Take 1 tablet (4 mg total) by mouth daily as needed for up to 20 doses for nausea or vomiting. 06/20/20   Dion Saucier D, PA      Allergies    Patient has no known allergies.    Review of Systems   Review of Systems  All other systems reviewed and are negative.   Physical Exam Updated Vital Signs BP 129/77 (BP Location: Left  Arm)   Pulse 79   Temp 99.1 F (37.3 C) (Oral)   Resp 18   Ht 5\' 9"  (1.753 m)   Wt 63.5 kg   SpO2 93%   BMI 20.67 kg/m  Physical Exam Vitals and nursing note reviewed.  Constitutional:      General: He is not in acute distress.    Appearance: He is well-developed.     Comments: Patient states that as he was relaxing in the room, symptoms have significantly better and he feels like he has been "working himself up about my symptoms."  HENT:     Head: Normocephalic and atraumatic.  Eyes:     Conjunctiva/sclera: Conjunctivae normal.  Cardiovascular:     Rate and Rhythm: Normal rate and regular rhythm.     Heart sounds: No murmur heard. Pulmonary:     Effort: Pulmonary effort is normal. No respiratory distress.     Breath sounds: Normal breath sounds. No wheezing or rales.  Abdominal:     General: Abdomen is flat.     Palpations: Abdomen is soft.     Tenderness: There is no abdominal tenderness.  Musculoskeletal:        General: No swelling.     Cervical back: Neck supple.     Comments: Patient has full range of motion of bilateral upper  extremities at shoulder, elbow, wrist, digits.  Radial pulses full and intact bilaterally.  Capillary fill less than 2 seconds bilaterally.  Grip strength 5 out of 5 bilaterally, elbow flexion extension 5 out of 5 bilaterally.  Patient has some subjective numbness over left palm and first through third digits.  Skin:    General: Skin is warm and dry.     Capillary Refill: Capillary refill takes less than 2 seconds.  Neurological:     Mental Status: He is alert.  Psychiatric:        Mood and Affect: Mood normal.     ED Results / Procedures / Treatments   Labs (all labs ordered are listed, but only abnormal results are displayed) Labs Reviewed - No data to display  EKG None  Radiology DG Wrist Complete Left  Result Date: 07/23/2022 CLINICAL DATA:  Possible injury. Left hand and arm tingling with pain while lifting packages. History of  prior fracture in 2018. EXAM: LEFT WRIST - COMPLETE 3+ VIEW COMPARISON:  05/19/2017 FINDINGS: Old healed fracture deformity of the distal left radius. Old ununited ulnar styloid process fragment. No acute fracture or dislocation. Mild degenerative changes in the radiocarpal and STT joints. No focal bone lesion or bone destruction. Soft tissues are unremarkable. IMPRESSION: Old fracture deformities of the distal left radius and ulna as described. Early degenerative changes suggested in the radiocarpal and STT joints. Electronically Signed   By: Burman Nieves M.D.   On: 07/23/2022 20:23    Procedures Procedures    Medications Ordered in ED Medications  predniSONE (DELTASONE) tablet 60 mg (has no administration in time range)    ED Course/ Medical Decision Making/ A&P Clinical Course as of 07/23/22 2301  Tue Jul 23, 2022  2222 DG Wrist Complete Left [CR]    Clinical Course User Index [CR] Peter Garter, PA                           Medical Decision Making Amount and/or Complexity of Data Reviewed Radiology: ordered. Decision-making details documented in ED Course.  Risk Prescription drug management.   This patient presents to the ED for concern of left hand pain/numbness, this involves an extensive number of treatment options, and is a complaint that carries with it a high risk of complications and morbidity.  The differential diagnosis includes radiculopathy, trigger finger, ischemic limb, cellulitis/erysipelas, fracture, strain/sprain, median nerve impingement, ulnar nerve impingement.   Co morbidities that complicate the patient evaluation  See HPI   Additional history obtained:  Additional history obtained from EMR External records from outside source obtained and reviewed including hospital records   Lab Tests:  N/a   Imaging Studies ordered:  I ordered imaging studies including left wrist x-ray I independently visualized and interpreted imaging which showed  old fracture deformity of left distal radius with ununited ulnar styloid process fragment.  No acute fracture or dislocation.  Mild degenerative changes in the radial carpal and STT joints. I agree with the radiologist interpretation  Cardiac Monitoring: / EKG:  The patient was maintained on a cardiac monitor.  I personally viewed and interpreted the cardiac monitored which showed an underlying rhythm of: Sinus rhythm   Consultations Obtained:  N/a   Problem List / ED Course / Critical interventions / Medication management  Left hand pain I ordered medication including prednisone for radiculopathy   Reevaluation of the patient after these medicines showed that the patient improved I have reviewed the  patients home medicines and have made adjustments as needed   Social Determinants of Health:  Reports chronic cigarette use.  Denies illicit drug use.   Test / Admission - Considered:  Carpal tunnel syndrome Vitals signs within normal range and stable throughout visit. Imaging studies significant for: See above Patient symptoms of numbness likely secondary to median nerve radiculopathy.  Although no palpable nodule/catch or click suspicious for trigger finger noticed during physical exam, still possibility given report of patient's symptoms.  Patient advised to avoid double shifts until symptoms resolve.  In the meantime, prednisone taper recommended with close hand surgery follow-up.  Treat plan discussed length with patient he knowledge understanding was agreeable to said plan. Worrisome signs and symptoms were discussed with the patient, and the patient acknowledged understanding to return to the ED if noticed. Patient was stable upon discharge.          Final Clinical Impression(s) / ED Diagnoses Final diagnoses:  Left hand pain    Rx / DC Orders ED Discharge Orders          Ordered    predniSONE (STERAPRED UNI-PAK 21 TAB) 10 MG (21) TBPK tablet  Daily         07/23/22 2300              Wilnette Kales, [place] 07/23/22 2301    Drenda Freeze, MD 07/23/22 2329

## 2022-07-23 NOTE — ED Triage Notes (Signed)
Pt reports left hand and arm tingling and pain while works, pt further reports joints lock up while at work x 1 day

## 2022-07-23 NOTE — Discharge Instructions (Signed)
Note the visit emergency department today was overall reassuring.  As discussed, we will treat your symptoms with a prednisone pack.  I will attach information for the hand specialist to follow-up with.  In the meantime, you can rest, ice affected area as well as use NSAIDs such as ibuprofen/Aleve as needed for pain/inflammation.  Please do not hesitate to return to the emergency department for worrisome signs and symptoms we discussed become apparent.

## 2023-12-15 ENCOUNTER — Ambulatory Visit (HOSPITAL_COMMUNITY)

## 2024-03-17 ENCOUNTER — Ambulatory Visit
Admission: RE | Admit: 2024-03-17 | Discharge: 2024-03-17 | Disposition: A | Discharge status: Home / Self Care | Source: Ambulatory Visit | Attending: Physician Assistant | Admitting: Physician Assistant

## 2024-03-17 ENCOUNTER — Ambulatory Visit

## 2024-03-17 ENCOUNTER — Other Ambulatory Visit: Payer: Self-pay

## 2024-03-17 VITALS — BP 142/71 | HR 62 | Temp 97.8°F | Resp 18 | Ht 69.5 in | Wt 135.0 lb

## 2024-03-17 DIAGNOSIS — S46911A Strain of unspecified muscle, fascia and tendon at shoulder and upper arm level, right arm, initial encounter: Secondary | ICD-10-CM | POA: Diagnosis not present

## 2024-03-17 MED ORDER — PREDNISONE 20 MG PO TABS: ORAL_TABLET | ORAL | 0 refills | Status: AC
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
## 2024-03-17 NOTE — ED Notes (Addendum)
 Pt denied sling at this time. States he does have one at home and verbalized understanding on how to apply. Erin PA made aware. See further orders.

## 2024-03-17 NOTE — Discharge Instructions (Addendum)
 You were seen today for concerns of right shoulder pain.  At this time I suspect you likely have a muscular injury but given your previous history of surgeries on the shoulder I am concerned that this could become a more extensive and complicated injury. I am sending you home with a prescription for a steroid taper to assist with inflammation and pain.  As needed you can continue using Tylenol  and ibuprofen  for further pain management.  You can also try applying lidocaine  patches, warm compresses to the area, massage and gentle exercises as tolerated. We have sent you home with a shoulder sling/shoulder immobilizer to help with supporting your shoulder.  Please use this as needed during the day to help support your shoulder and arm. I do recommend further follow-up with orthopedics for further evaluation and ongoing management.  I have included the contact information for 2 orthopedic clinics in the area that have urgent care availability for you to review and use as desired.  I have sent in a script for Prednisone  taper to be taken in the morning with breakfast per the instructions on the container Remember that steroids can cause sleeplessness, irritability, increased hunger and elevated glucose levels so be mindful of these side effects. They should lessen as you progress to the lower doses of the taper.  [facility] [address], [place], [place] [address] [phone]  [facility] [address] [place], [place] [address]  [phone]

## 2024-03-17 NOTE — ED Triage Notes (Signed)
 Pt presents with complaints of right shoulder pain x 2 weeks. Pt goes on to explain of recent injury when playing basketball about two weeks ago, collided with someone. Pt currently rates his overall pain a 3/10. OTC Ibuprofen  + ice at home. Work requires constant movement and heavy lifting, has made the pain worse.
